# Patient Record
Sex: Female | Born: 1980 | Race: White | Hispanic: No | Marital: Married | Smoking: Former smoker
Health system: Southern US, Community
[De-identification: ages and names within clinical notes are randomized; demographics above are authoritative.]

## PROBLEM LIST (undated history)

## (undated) DIAGNOSIS — E079 Disorder of thyroid, unspecified: Secondary | ICD-10-CM

## (undated) DIAGNOSIS — I10 Essential (primary) hypertension: Secondary | ICD-10-CM

## (undated) DIAGNOSIS — E119 Type 2 diabetes mellitus without complications: Secondary | ICD-10-CM

## (undated) DIAGNOSIS — E669 Obesity, unspecified: Secondary | ICD-10-CM

## (undated) HISTORY — PX: WISDOM TOOTH EXTRACTION: SHX21

## (undated) HISTORY — DX: Disorder of thyroid, unspecified: E07.9

## (undated) HISTORY — DX: Obesity, unspecified: E66.9

## (undated) HISTORY — PX: GANGLION CYST EXCISION: SHX1691

---
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
## 2004-05-21 ENCOUNTER — Ambulatory Visit: Payer: Self-pay | Admitting: Internal Medicine

## 2004-05-31 ENCOUNTER — Ambulatory Visit: Payer: Self-pay | Admitting: Internal Medicine

## 2004-07-09 ENCOUNTER — Ambulatory Visit: Payer: Self-pay | Admitting: Internal Medicine

## 2004-07-29 ENCOUNTER — Ambulatory Visit: Payer: Self-pay | Admitting: Internal Medicine

## 2004-08-22 ENCOUNTER — Ambulatory Visit: Payer: Self-pay | Admitting: Internal Medicine

## 2004-09-17 ENCOUNTER — Ambulatory Visit: Payer: Self-pay | Admitting: Internal Medicine

## 2005-01-27 ENCOUNTER — Ambulatory Visit: Payer: Self-pay | Admitting: Internal Medicine

## 2005-01-28 ENCOUNTER — Ambulatory Visit: Payer: Self-pay | Admitting: Internal Medicine

## 2005-04-25 ENCOUNTER — Other Ambulatory Visit: Admission: RE | Admit: 2005-04-25 | Discharge: 2005-04-25 | Payer: Self-pay | Admitting: Obstetrics and Gynecology

## 2005-07-28 ENCOUNTER — Ambulatory Visit: Payer: Self-pay | Admitting: Internal Medicine

## 2006-05-15 ENCOUNTER — Other Ambulatory Visit: Admission: RE | Admit: 2006-05-15 | Discharge: 2006-05-15 | Payer: Self-pay | Admitting: Obstetrics & Gynecology

## 2007-05-28 ENCOUNTER — Other Ambulatory Visit: Admission: RE | Admit: 2007-05-28 | Discharge: 2007-05-28 | Payer: Self-pay | Admitting: Obstetrics and Gynecology

## 2007-08-14 ENCOUNTER — Emergency Department (HOSPITAL_COMMUNITY): Admission: EM | Admit: 2007-08-14 | Discharge: 2007-08-14 | Payer: Self-pay | Admitting: Emergency Medicine

## 2008-05-30 ENCOUNTER — Other Ambulatory Visit: Admission: RE | Admit: 2008-05-30 | Discharge: 2008-05-30 | Payer: Self-pay | Admitting: Obstetrics and Gynecology

## 2009-05-04 ENCOUNTER — Encounter: Admission: RE | Admit: 2009-05-04 | Discharge: 2009-05-14 | Payer: Self-pay | Admitting: Obstetrics and Gynecology

## 2009-06-12 ENCOUNTER — Inpatient Hospital Stay (HOSPITAL_COMMUNITY): Admission: AD | Admit: 2009-06-12 | Discharge: 2009-06-15 | Payer: Self-pay | Admitting: Obstetrics and Gynecology

## 2009-12-02 ENCOUNTER — Ambulatory Visit: Payer: Self-pay | Admitting: Diagnostic Radiology

## 2009-12-02 ENCOUNTER — Emergency Department (HOSPITAL_BASED_OUTPATIENT_CLINIC_OR_DEPARTMENT_OTHER): Admission: EM | Admit: 2009-12-02 | Discharge: 2009-12-02 | Payer: Self-pay | Admitting: Emergency Medicine

## 2009-12-04 ENCOUNTER — Encounter: Admission: RE | Admit: 2009-12-04 | Discharge: 2009-12-04 | Payer: Self-pay | Admitting: Family Medicine

## 2010-07-05 LAB — URINALYSIS, ROUTINE W REFLEX MICROSCOPIC
Bilirubin Urine: NEGATIVE
Nitrite: NEGATIVE
Specific Gravity, Urine: 1.024 (ref 1.005–1.030)
Urobilinogen, UA: 1 mg/dL (ref 0.0–1.0)

## 2010-07-11 LAB — COMPREHENSIVE METABOLIC PANEL
ALT: 17 U/L (ref 0–35)
AST: 22 U/L (ref 0–37)
BUN: 6 mg/dL (ref 6–23)
Calcium: 8.5 mg/dL (ref 8.4–10.5)
Chloride: 106 mEq/L (ref 96–112)
Creatinine, Ser: 0.72 mg/dL (ref 0.4–1.2)
GFR calc non Af Amer: >60 mL/min (ref >60)
Glucose, Bld: 101 mg/dL — ABNORMAL HIGH (ref 70–99)

## 2010-07-11 LAB — CBC
MCV: 93.4 fL (ref 78.0–100.0)
MCV: 94.9 fL (ref 78.0–100.0)
Platelets: 226 10*3/uL (ref 150–400)
RBC: 4.52 MIL/uL (ref 3.87–5.11)
WBC: 15.4 10*3/uL — ABNORMAL HIGH (ref 4.0–10.5)
WBC: 21.8 10*3/uL — ABNORMAL HIGH (ref 4.0–10.5)

## 2010-07-11 LAB — URIC ACID: Uric Acid, Serum: 5.8 mg/dL (ref 2.4–7.0)

## 2010-07-11 LAB — RPR: RPR Ser Ql: NONREACTIVE

## 2012-09-28 ENCOUNTER — Encounter: Payer: Self-pay | Admitting: *Deleted

## 2012-10-01 ENCOUNTER — Ambulatory Visit: Payer: Self-pay | Admitting: Certified Nurse Midwife

## 2012-10-06 ENCOUNTER — Telehealth: Payer: Self-pay | Admitting: Certified Nurse Midwife

## 2012-10-06 NOTE — Telephone Encounter (Signed)
Patient cancelled appointment for aex with Leota Sauers to 10/07/12. Patient is pregnant and is seeing an ob.

## 2012-10-11 ENCOUNTER — Ambulatory Visit: Payer: Self-pay | Admitting: Certified Nurse Midwife

## 2012-10-23 ENCOUNTER — Other Ambulatory Visit: Payer: Self-pay | Admitting: Certified Nurse Midwife

## 2012-10-29 LAB — OB RESULTS CONSOLE HEPATITIS B SURFACE ANTIGEN
Hepatitis B Surface Ag: NEGATIVE
Hepatitis B Surface Ag: NEGATIVE

## 2012-10-29 LAB — OB RESULTS CONSOLE ABO/RH
RH TYPE: POSITIVE
RH Type: POSITIVE

## 2012-10-29 LAB — OB RESULTS CONSOLE ANTIBODY SCREEN
Antibody Screen: NEGATIVE
Antibody Screen: NEGATIVE

## 2012-10-29 LAB — OB RESULTS CONSOLE HIV ANTIBODY (ROUTINE TESTING)
HIV: NONREACTIVE
HIV: NONREACTIVE

## 2012-10-29 LAB — OB RESULTS CONSOLE GBS: GBS: NEGATIVE

## 2012-10-29 LAB — OB RESULTS CONSOLE RPR
RPR: NONREACTIVE
RPR: NONREACTIVE

## 2012-10-29 LAB — OB RESULTS CONSOLE RUBELLA ANTIBODY, IGM
RUBELLA: IMMUNE
Rubella: IMMUNE

## 2012-11-19 LAB — OB RESULTS CONSOLE GC/CHLAMYDIA
CHLAMYDIA, DNA PROBE: NEGATIVE
CHLAMYDIA, DNA PROBE: NEGATIVE
GC PROBE AMP, GENITAL: NEGATIVE
Gonorrhea: NEGATIVE

## 2013-05-15 ENCOUNTER — Inpatient Hospital Stay (HOSPITAL_COMMUNITY): Payer: 59

## 2013-05-15 ENCOUNTER — Encounter (HOSPITAL_COMMUNITY): Payer: Self-pay | Admitting: *Deleted

## 2013-05-15 ENCOUNTER — Inpatient Hospital Stay (HOSPITAL_COMMUNITY)
Admission: AD | Admit: 2013-05-15 | Discharge: 2013-05-15 | Disposition: A | Discharge status: Home / Self Care | Payer: 59 | Source: Ambulatory Visit | Attending: Obstetrics and Gynecology | Admitting: Obstetrics and Gynecology

## 2013-05-15 DIAGNOSIS — R030 Elevated blood-pressure reading, without diagnosis of hypertension: Secondary | ICD-10-CM | POA: Insufficient documentation

## 2013-05-15 DIAGNOSIS — O99891 Other specified diseases and conditions complicating pregnancy: Secondary | ICD-10-CM | POA: Insufficient documentation

## 2013-05-15 DIAGNOSIS — O99330 Smoking (tobacco) complicating pregnancy, unspecified trimester: Secondary | ICD-10-CM | POA: Insufficient documentation

## 2013-05-15 DIAGNOSIS — O4700 False labor before 37 completed weeks of gestation, unspecified trimester: Secondary | ICD-10-CM | POA: Insufficient documentation

## 2013-05-15 DIAGNOSIS — O9989 Other specified diseases and conditions complicating pregnancy, childbirth and the puerperium: Secondary | ICD-10-CM

## 2013-05-15 LAB — URINALYSIS, ROUTINE W REFLEX MICROSCOPIC
Glucose, UA: NEGATIVE mg/dL
KETONES UR: 15 mg/dL — AB
Nitrite: NEGATIVE
PH: 6 (ref 5.0–8.0)
PROTEIN: NEGATIVE mg/dL
Urobilinogen, UA: 0.2 mg/dL (ref 0.0–1.0)

## 2013-05-15 LAB — CBC
HCT: 32.3 % — ABNORMAL LOW (ref 36.0–46.0)
Hemoglobin: 10.9 g/dL — ABNORMAL LOW (ref 12.0–15.0)
MCH: 29.4 pg (ref 26.0–34.0)
MCHC: 33.7 g/dL (ref 30.0–36.0)
MCV: 87.1 fL (ref 78.0–100.0)
Platelets: 179 10*3/uL (ref 150–400)
RBC: 3.71 MIL/uL — ABNORMAL LOW (ref 3.87–5.11)
RDW: 13.7 % (ref 11.5–15.5)
WBC: 6.7 10*3/uL (ref 4.0–10.5)

## 2013-05-15 LAB — COMPREHENSIVE METABOLIC PANEL
ALK PHOS: 226 U/L — AB (ref 39–117)
ALT: 16 U/L (ref 0–35)
AST: 27 U/L (ref 0–37)
Albumin: 2.2 g/dL — ABNORMAL LOW (ref 3.5–5.2)
BUN: 8 mg/dL (ref 6–23)
CALCIUM: 8.9 mg/dL (ref 8.4–10.5)
CO2: 21 meq/L (ref 19–32)
Chloride: 99 mEq/L (ref 96–112)
Creatinine, Ser: 0.72 mg/dL (ref 0.50–1.10)
GFR calc non Af Amer: >90 mL/min (ref >90)
GLUCOSE: 90 mg/dL (ref 70–99)
POTASSIUM: 4.3 meq/L (ref 3.7–5.3)
Sodium: 133 mEq/L — ABNORMAL LOW (ref 137–147)
Total Bilirubin: 0.3 mg/dL (ref 0.3–1.2)
Total Protein: 5.9 g/dL — ABNORMAL LOW (ref 6.0–8.3)

## 2013-05-15 LAB — URINE MICROSCOPIC-ADD ON

## 2013-05-15 LAB — LACTATE DEHYDROGENASE: LDH: 236 U/L (ref 94–250)

## 2013-05-15 LAB — URIC ACID: Uric Acid, Serum: 6 mg/dL (ref 2.4–7.0)

## 2013-05-15 NOTE — MAU Provider Note (Signed)
History     CSN: [id number]  Arrival date and time: 05/15/13 4098   None     Chief Complaint  Patient presents with  . Labor Eval  . Urinary Tract Infection   Urinary Tract Infection     Brandi [name] is a 33 y.o. [phone] at [redacted]w[redacted]d who originally presented with contractions. She was found to have elevated B/P. She denies any blurry vision, visual changes, chest pain or RUQ pain. She does report a 2/10 headache that she has not taken anything for at this time. She reports that her last baby was born around 80 weeks, and she had B/P problems with that pregnancy.   Past Medical History  Diagnosis Date  . Thyroid disease   . Obesity     Past Surgical History  Procedure Laterality Date  . Ganglion cyst excision    . Wisdom tooth extraction      Family History  Problem Relation Age of Onset  . Hypertension Mother   . Thyroid disease Mother   . Hypertension Father   . Cancer Paternal Grandfather     skin cancer    History  Substance Use Topics  . Smoking status: Current Every Day Smoker -- 0.50 packs/day    Types: Cigarettes  . Smokeless tobacco: Never Used  . Alcohol Use: No    Allergies:  Allergies  Allergen Reactions  . Augmentin [Amoxicillin-Pot Clavulanate] Nausea And Vomiting  . Sulfa Antibiotics Hives    Prescriptions prior to admission  Medication Sig Dispense Refill  . calcium carbonate (TUMS - DOSED IN MG ELEMENTAL CALCIUM) 500 MG chewable tablet Chew 1 tablet by mouth 4 (four) times daily as needed for indigestion or heartburn.      . levothyroxine (SYNTHROID, LEVOTHROID) 88 MCG tablet Take 88 mcg by mouth daily before breakfast.      . Prenatal Vit-Min-FA-Fish Oil ([employer] PRENATAL GUMMY PO) Take 2 tablets by mouth daily.        ROS Physical Exam   Blood pressure 126/68, pulse 118, temperature 97.9 F (36.6 C), temperature source Oral, resp. rate 20, height 5\' 4"  (1.626 m), weight 121.564 kg (268 lb), last menstrual period 08/31/2012.  Physical  Exam  Nursing note and vitals reviewed. Constitutional: She is oriented to person, place, and time. She appears well-developed and well-nourished. No distress.  Cardiovascular: Normal rate.   Respiratory: Effort normal.  GI: Soft. There is no tenderness.  Neurological: She is alert and oriented to person, place, and time.  Skin: Skin is warm and dry.  Psychiatric: She has a normal mood and affect.   FHT: 140, moderate with 15x15 accels, no decels Toco: irregular UC.  MAU Course  Procedures  Results for orders placed during the hospital encounter of 05/15/13 (from the past 24 hour(s))  URINALYSIS, ROUTINE W REFLEX MICROSCOPIC     Status: Abnormal   Collection Time    05/15/13 10:00 AM      Result Value Range   Color, Urine YELLOW  YELLOW   APPearance CLOUDY (*) CLEAR   Specific Gravity, Urine >1.030 (*) 1.005 - 1.030   pH 6.0  5.0 - 8.0   Glucose, UA NEGATIVE  NEGATIVE mg/dL   Hgb urine dipstick TRACE (*) NEGATIVE   Bilirubin Urine SMALL (*) NEGATIVE   Ketones, ur 15 (*) NEGATIVE mg/dL   Protein, ur NEGATIVE  NEGATIVE mg/dL   Urobilinogen, UA 0.2  0.0 - 1.0 mg/dL   Nitrite NEGATIVE  NEGATIVE   Leukocytes, UA SMALL (*) NEGATIVE  URINE MICROSCOPIC-ADD ON     Status: Abnormal   Collection Time    05/15/13 10:00 AM      Result Value Range   Squamous Epithelial / LPF MANY (*) RARE   WBC, UA 11-20  <3 WBC/hpf   RBC / HPF 0-2  <3 RBC/hpf   Bacteria, UA MANY (*) RARE  CBC     Status: Abnormal   Collection Time    05/15/13 11:21 AM      Result Value Range   WBC 6.7  4.0 - 10.5 K/uL   RBC 3.71 (*) 3.87 - 5.11 MIL/uL   Hemoglobin 10.9 (*) 12.0 - 15.0 g/dL   HCT 81.132.3 (*) 91.436.0 - 78.246.0 %   MCV 87.1  78.0 - 100.0 fL   MCH 29.4  26.0 - 34.0 pg   MCHC 33.7  30.0 - 36.0 g/dL   RDW 95.613.7  21.311.5 - 08.615.5 %   Platelets 179  150 - 400 K/uL  URIC ACID     Status: None   Collection Time    05/15/13 11:21 AM      Result Value Range   Uric Acid, Serum 6.0  2.4 - 7.0 mg/dL  COMPREHENSIVE  METABOLIC PANEL     Status: Abnormal   Collection Time    05/15/13 11:21 AM      Result Value Range   Sodium 133 (*) 137 - 147 mEq/L   Potassium 4.3  3.7 - 5.3 mEq/L   Chloride 99  96 - 112 mEq/L   CO2 21  19 - 32 mEq/L   Glucose, Bld 90  70 - 99 mg/dL   BUN 8  6 - 23 mg/dL   Creatinine, Ser 5.780.72  0.50 - 1.10 mg/dL   Calcium 8.9  8.4 - 46.910.5 mg/dL   Total Protein 5.9 (*) 6.0 - 8.3 g/dL   Albumin 2.2 (*) 3.5 - 5.2 g/dL   AST 27  0 - 37 U/L   ALT 16  0 - 35 U/L   Alkaline Phosphatase 226 (*) 39 - 117 U/L   Total Bilirubin 0.3  0.3 - 1.2 mg/dL   GFR calc non Af Amer >90  >90 mL/min   GFR calc Af Amer >90  >90 mL/min  LACTATE DEHYDROGENASE     Status: None   Collection Time    05/15/13 11:21 AM      Result Value Range   LDH 236  94 - 250 U/L   [place] Ob Limited  05/15/2013   OBSTETRICAL ULTRASOUND: This exam was performed within a [facility]. The OB US report was generated in the AS system, and faxed to the ordering physician.   This report is also available in [facility] [employer] and in the [employer] PACS. See AS Obstetric US report.  [place] Fetal Bpp W/o Non Stress  05/15/2013   OBSTETRICAL ULTRASOUND: This exam was performed within a [facility]. The OB US report was generated in the AS system, and faxed to the ordering physician.   This report is also available in [facility] [employer] and in the [facility] [employer] PACS. See AS Obstetric US report.   1115: [facility]e [facility].  1350: Dr. Rana SnareLowe states that he has reviewed all labs, ultrasound and NST. Patient may be DC home.   Assessment and Plan  False labor Elevated B/P in 3rd trimester  Pre-eclampsia danger signs reviewed FU with the office as planned Return to MAU as needed   Mathews RobinsonsHogan,  Franco Collet 05/15/2013, 11:20 AM

## 2013-05-15 NOTE — MAU Note (Signed)
Patient presents with complaint of irregular contractions since last night and a possible UTI.

## 2013-05-15 NOTE — Discharge Instructions (Signed)

## 2013-05-16 LAB — URINE CULTURE: Colony Count: 35000

## 2013-05-24 ENCOUNTER — Encounter (HOSPITAL_COMMUNITY): Payer: Self-pay

## 2013-05-24 ENCOUNTER — Inpatient Hospital Stay (HOSPITAL_COMMUNITY): Payer: 59 | Admitting: Anesthesiology

## 2013-05-24 ENCOUNTER — Encounter (HOSPITAL_COMMUNITY): Payer: 59 | Admitting: Anesthesiology

## 2013-05-24 ENCOUNTER — Inpatient Hospital Stay (HOSPITAL_COMMUNITY)
Admission: AD | Admit: 2013-05-24 | Discharge: 2013-05-27 | DRG: 767 | Disposition: A | Discharge status: Home / Self Care | Payer: 59 | Source: Ambulatory Visit | Attending: Obstetrics and Gynecology | Admitting: Obstetrics and Gynecology

## 2013-05-24 DIAGNOSIS — O1490 Unspecified pre-eclampsia, unspecified trimester: Secondary | ICD-10-CM | POA: Diagnosis present

## 2013-05-24 DIAGNOSIS — E669 Obesity, unspecified: Secondary | ICD-10-CM | POA: Diagnosis present

## 2013-05-24 DIAGNOSIS — O139 Gestational [pregnancy-induced] hypertension without significant proteinuria, unspecified trimester: Principal | ICD-10-CM | POA: Diagnosis present

## 2013-05-24 DIAGNOSIS — Z87891 Personal history of nicotine dependence: Secondary | ICD-10-CM

## 2013-05-24 DIAGNOSIS — E079 Disorder of thyroid, unspecified: Secondary | ICD-10-CM | POA: Diagnosis present

## 2013-05-24 DIAGNOSIS — K219 Gastro-esophageal reflux disease without esophagitis: Secondary | ICD-10-CM | POA: Diagnosis present

## 2013-05-24 DIAGNOSIS — R7989 Other specified abnormal findings of blood chemistry: Secondary | ICD-10-CM | POA: Diagnosis present

## 2013-05-24 DIAGNOSIS — O99214 Obesity complicating childbirth: Secondary | ICD-10-CM

## 2013-05-24 DIAGNOSIS — O99284 Endocrine, nutritional and metabolic diseases complicating childbirth: Secondary | ICD-10-CM

## 2013-05-24 HISTORY — DX: Essential (primary) hypertension: I10

## 2013-05-24 LAB — COMPREHENSIVE METABOLIC PANEL
ALT: 139 U/L — AB (ref 0–35)
AST: 98 U/L — ABNORMAL HIGH (ref 0–37)
Albumin: 2.2 g/dL — ABNORMAL LOW (ref 3.5–5.2)
Alkaline Phosphatase: 329 U/L — ABNORMAL HIGH (ref 39–117)
BUN: 10 mg/dL (ref 6–23)
CO2: 22 mEq/L (ref 19–32)
Calcium: 8.7 mg/dL (ref 8.4–10.5)
Chloride: 102 mEq/L (ref 96–112)
Creatinine, Ser: 0.94 mg/dL (ref 0.50–1.10)
GFR calc non Af Amer: 79 mL/min — ABNORMAL LOW (ref >90)
GLUCOSE: 100 mg/dL — AB (ref 70–99)
Potassium: 4 mEq/L (ref 3.7–5.3)
Sodium: 137 mEq/L (ref 137–147)
Total Bilirubin: 1.6 mg/dL — ABNORMAL HIGH (ref 0.3–1.2)
Total Protein: 6.2 g/dL (ref 6.0–8.3)

## 2013-05-24 LAB — URINALYSIS, ROUTINE W REFLEX MICROSCOPIC
Glucose, UA: NEGATIVE mg/dL
Ketones, ur: NEGATIVE mg/dL
Nitrite: NEGATIVE
PH: 5.5 (ref 5.0–8.0)
Protein, ur: NEGATIVE mg/dL
Specific Gravity, Urine: >1.03 — ABNORMAL HIGH (ref 1.005–1.030)
UROBILINOGEN UA: 0.2 mg/dL (ref 0.0–1.0)

## 2013-05-24 LAB — URINE MICROSCOPIC-ADD ON

## 2013-05-24 LAB — CBC
HCT: 36.4 % (ref 36.0–46.0)
HEMOGLOBIN: 12.2 g/dL (ref 12.0–15.0)
MCH: 29.2 pg (ref 26.0–34.0)
MCHC: 33.5 g/dL (ref 30.0–36.0)
MCV: 87.1 fL (ref 78.0–100.0)
Platelets: 231 10*3/uL (ref 150–400)
RBC: 4.18 MIL/uL (ref 3.87–5.11)
RDW: 14.1 % (ref 11.5–15.5)
WBC: 11.6 10*3/uL — ABNORMAL HIGH (ref 4.0–10.5)

## 2013-05-24 LAB — URIC ACID: URIC ACID, SERUM: 6.3 mg/dL (ref 2.4–7.0)

## 2013-05-24 MED ORDER — FLEET ENEMA 7-19 GM/118ML RE ENEM: 1.0000 | ENEMA | RECTAL | Status: DC | PRN

## 2013-05-24 MED ORDER — ONDANSETRON HCL 4 MG/2ML IJ SOLN
4.0000 mg | Freq: Four times a day (QID) | INTRAMUSCULAR | Status: DC | PRN
Start: 2013-05-24 — End: 2013-05-25
  Administered 2013-05-25: 4 mg via INTRAVENOUS
  Filled 2013-05-24: qty 2

## 2013-05-24 MED ORDER — ACETAMINOPHEN 325 MG PO TABS: 650.0000 mg | ORAL_TABLET | ORAL | Status: DC | PRN

## 2013-05-24 MED ORDER — PHENYLEPHRINE 40 MCG/ML (10ML) SYRINGE FOR IV PUSH (FOR BLOOD PRESSURE SUPPORT): 80.0000 ug | PREFILLED_SYRINGE | INTRAVENOUS | Status: DC | PRN

## 2013-05-24 MED ORDER — DIPHENHYDRAMINE HCL 50 MG/ML IJ SOLN: 12.5000 mg | INTRAMUSCULAR | Status: DC | PRN

## 2013-05-24 MED ORDER — OXYCODONE-ACETAMINOPHEN 5-325 MG PO TABS: 1.0000 | ORAL_TABLET | ORAL | Status: DC | PRN

## 2013-05-24 MED ORDER — LEVOTHYROXINE SODIUM 88 MCG PO TABS
88.0000 ug | ORAL_TABLET | Freq: Every day | ORAL | Status: DC
  Filled 2013-05-24: qty 1

## 2013-05-24 MED ORDER — FENTANYL 2.5 MCG/ML BUPIVACAINE 1/10 % EPIDURAL INFUSION (WH - ANES)
14.0000 mL/h | INTRAMUSCULAR | Status: DC | PRN
  Filled 2013-05-24: qty 125

## 2013-05-24 MED ORDER — OXYTOCIN 40 UNITS IN LACTATED RINGERS INFUSION - SIMPLE MED
62.5000 mL/h | INTRAVENOUS | Status: DC
  Filled 2013-05-24: qty 1000

## 2013-05-24 MED ORDER — CITRIC ACID-SODIUM CITRATE 334-500 MG/5ML PO SOLN
30.0000 mL | ORAL | Status: DC | PRN
  Administered 2013-05-25 (×2): 30 mL via ORAL
  Filled 2013-05-24 (×2): qty 15

## 2013-05-24 MED ORDER — LIDOCAINE HCL (PF) 1 % IJ SOLN
30.0000 mL | INTRAMUSCULAR | Status: DC | PRN
  Filled 2013-05-24: qty 30

## 2013-05-24 MED ORDER — OXYTOCIN BOLUS FROM INFUSION: 500.0000 mL | INTRAVENOUS | Status: DC

## 2013-05-24 MED ORDER — LACTATED RINGERS IV SOLN
500.0000 mL | Freq: Once | INTRAVENOUS | Status: AC
  Administered 2013-05-24: 500 mL via INTRAVENOUS

## 2013-05-24 MED ORDER — MAGNESIUM SULFATE BOLUS VIA INFUSION
4.0000 g | Freq: Once | INTRAVENOUS | Status: AC
  Administered 2013-05-25: 4 g via INTRAVENOUS
  Filled 2013-05-24: qty 500

## 2013-05-24 MED ORDER — LACTATED RINGERS IV SOLN
INTRAVENOUS | Status: DC
  Administered 2013-05-25 (×2): via INTRAVENOUS

## 2013-05-24 MED ORDER — PHENYLEPHRINE 40 MCG/ML (10ML) SYRINGE FOR IV PUSH (FOR BLOOD PRESSURE SUPPORT)
80.0000 ug | PREFILLED_SYRINGE | INTRAVENOUS | Status: DC | PRN
  Filled 2013-05-24: qty 10

## 2013-05-24 MED ORDER — IBUPROFEN 600 MG PO TABS: 600.0000 mg | ORAL_TABLET | Freq: Four times a day (QID) | ORAL | Status: DC | PRN

## 2013-05-24 MED ORDER — BUTORPHANOL TARTRATE 1 MG/ML IJ SOLN: 1.0000 mg | INTRAMUSCULAR | Status: DC | PRN

## 2013-05-24 MED ORDER — LIDOCAINE HCL (PF) 1 % IJ SOLN
INTRAMUSCULAR | Status: DC | PRN
  Administered 2013-05-24 (×2): 4 mL

## 2013-05-24 MED ORDER — MAGNESIUM SULFATE 40 G IN LACTATED RINGERS - SIMPLE
2.0000 g/h | INTRAVENOUS | Status: DC
  Administered 2013-05-25: 2 g/h via INTRAVENOUS
  Filled 2013-05-24: qty 500

## 2013-05-24 MED ORDER — LACTATED RINGERS IV SOLN: 500.0000 mL | INTRAVENOUS | Status: DC | PRN

## 2013-05-24 MED ORDER — FENTANYL 2.5 MCG/ML BUPIVACAINE 1/10 % EPIDURAL INFUSION (WH - ANES)
INTRAMUSCULAR | Status: DC | PRN
  Administered 2013-05-24: 14 mL/h via EPIDURAL

## 2013-05-24 MED ORDER — EPHEDRINE 5 MG/ML INJ
10.0000 mg | INTRAVENOUS | Status: DC | PRN
  Filled 2013-05-24: qty 4

## 2013-05-24 MED ORDER — EPHEDRINE 5 MG/ML INJ: 10.0000 mg | INTRAVENOUS | Status: DC | PRN

## 2013-05-24 NOTE — Anesthesia Procedure Notes (Signed)
Epidural Patient location during procedure: OB Start time: 05/24/2013 11:52 PM  Staffing Anesthesiologist: Shantia Sanford A. Performed by: anesthesiologist   Preanesthetic Checklist Completed: patient identified, site marked, surgical consent, pre-op evaluation, timeout performed, IV checked, risks and benefits discussed and monitors and equipment checked  Epidural Patient position: sitting Prep: site prepped and draped and DuraPrep Patient monitoring: continuous pulse ox and blood pressure Approach: midline Injection technique: LOR air  Needle:  Needle type: Tuohy  Needle gauge: 17 G Needle length: 9 cm and 9 Needle insertion depth: 9 cm Catheter type: closed end flexible Catheter size: 19 Gauge Catheter at skin depth: 14 cm Test dose: negative and Other  Assessment Events: blood not aspirated, injection not painful, no injection resistance, negative IV test and no paresthesia  Additional Notes Patient identified. Risks and benefits discussed including failed block, incomplete  Pain control, post dural puncture headache, nerve damage, paralysis, blood pressure Changes, nausea, vomiting, reactions to medications-both toxic and allergic and post Partum back pain. All questions were answered. Patient expressed understanding and wished to proceed. Sterile technique was used throughout procedure. Epidural site was Dressed with sterile barrier dressing. No paresthesias, signs of intravascular injection Or signs of intrathecal spread were encountered.  Patient was more comfortable after the epidural was dosed. Please see RN's note for documentation of vital signs and FHR which are stable.

## 2013-05-24 NOTE — MAU Note (Signed)
Was 3 cm last week.  Having ctxs about 9 min apart.  Denies bleeding.  Reports yellow mucus d/c.

## 2013-05-24 NOTE — Anesthesia Preprocedure Evaluation (Signed)
Anesthesia Evaluation  Patient identified by MRN, date of birth, ID band Patient awake    Reviewed: Allergy & Precautions, H&P , Patient's Chart, lab work & pertinent test results  Airway Mallampati: III TM Distance: >3 FB Neck ROM: Full    Dental no notable dental hx. (+) Teeth Intact   Pulmonary former smoker,  breath sounds clear to auscultation  Pulmonary exam normal       Cardiovascular hypertension, Pt. on medications Rhythm:Regular Rate:Normal     Neuro/Psych negative neurological ROS  negative psych ROS   GI/Hepatic GERD-  ,Elevated LFT's   Endo/Other  Morbid obesity  Renal/GU negative Renal ROS  negative genitourinary   Musculoskeletal negative musculoskeletal ROS (+)   Abdominal (+) + obese,   Peds  Hematology negative hematology ROS (+)   Anesthesia Other Findings   Reproductive/Obstetrics (+) Pregnancy Pre ecclampsia                           Anesthesia Physical Anesthesia Plan  ASA: III  Anesthesia Plan: Epidural   Post-op Pain Management:    Induction:   Airway Management Planned: Natural Airway  Additional Equipment:   Intra-op Plan:   Post-operative Plan:   Informed Consent: I have reviewed the patients History and Physical, chart, labs and discussed the procedure including the risks, benefits and alternatives for the proposed anesthesia with the patient or authorized representative who has indicated his/her understanding and acceptance.     Plan Discussed with: Anesthesiologist  Anesthesia Plan Comments:         Anesthesia Quick Evaluation

## 2013-05-25 ENCOUNTER — Encounter (HOSPITAL_COMMUNITY): Payer: Self-pay

## 2013-05-25 ENCOUNTER — Encounter (HOSPITAL_COMMUNITY)
Admission: AD | Disposition: A | Discharge status: Home / Self Care | Payer: Self-pay | Source: Ambulatory Visit | Attending: Obstetrics and Gynecology

## 2013-05-25 ENCOUNTER — Encounter (HOSPITAL_COMMUNITY): Payer: 59 | Admitting: Anesthesiology

## 2013-05-25 ENCOUNTER — Inpatient Hospital Stay (HOSPITAL_COMMUNITY): Payer: 59 | Admitting: Anesthesiology

## 2013-05-25 HISTORY — PX: DILATION AND EVACUATION: SHX1459

## 2013-05-25 LAB — CBC
HCT: 32.8 % — ABNORMAL LOW (ref 36.0–46.0)
HEMATOCRIT: 36.1 % (ref 36.0–46.0)
HEMOGLOBIN: 10.9 g/dL — AB (ref 12.0–15.0)
Hemoglobin: 12.2 g/dL (ref 12.0–15.0)
MCH: 29.6 pg (ref 26.0–34.0)
MCH: 28.9 pg (ref 26.0–34.0)
MCHC: 33.8 g/dL (ref 30.0–36.0)
MCHC: 33.2 g/dL (ref 30.0–36.0)
MCV: 87.6 fL (ref 78.0–100.0)
MCV: 87 fL (ref 78.0–100.0)
PLATELETS: 240 10*3/uL (ref 150–400)
PLATELETS: 225 10*3/uL (ref 150–400)
RBC: 4.12 MIL/uL (ref 3.87–5.11)
RBC: 3.77 MIL/uL — AB (ref 3.87–5.11)
RDW: 14.2 % (ref 11.5–15.5)
RDW: 14.1 % (ref 11.5–15.5)
WBC: 16.6 10*3/uL — AB (ref 4.0–10.5)
WBC: 20.6 10*3/uL — AB (ref 4.0–10.5)

## 2013-05-25 LAB — TYPE AND SCREEN
ABO/RH(D): A POS
ANTIBODY SCREEN: NEGATIVE

## 2013-05-25 LAB — URIC ACID
Uric Acid, Serum: 6.7 mg/dL (ref 2.4–7.0)
Uric Acid, Serum: 6.3 mg/dL (ref 2.4–7.0)

## 2013-05-25 LAB — COMPREHENSIVE METABOLIC PANEL
ALBUMIN: 2.2 g/dL — AB (ref 3.5–5.2)
ALK PHOS: 337 U/L — AB (ref 39–117)
ALK PHOS: 306 U/L — AB (ref 39–117)
ALT: 124 U/L — ABNORMAL HIGH (ref 0–35)
ALT: 102 U/L — ABNORMAL HIGH (ref 0–35)
AST: 83 U/L — AB (ref 0–37)
AST: 70 U/L — AB (ref 0–37)
Albumin: 2 g/dL — ABNORMAL LOW (ref 3.5–5.2)
BILIRUBIN TOTAL: 1.6 mg/dL — AB (ref 0.3–1.2)
BUN: 11 mg/dL (ref 6–23)
BUN: 10 mg/dL (ref 6–23)
CHLORIDE: 102 meq/L (ref 96–112)
CHLORIDE: 100 meq/L (ref 96–112)
CO2: 21 mEq/L (ref 19–32)
CO2: 20 meq/L (ref 19–32)
Calcium: 8.8 mg/dL (ref 8.4–10.5)
Calcium: 8.1 mg/dL — ABNORMAL LOW (ref 8.4–10.5)
Creatinine, Ser: 1.1 mg/dL (ref 0.50–1.10)
Creatinine, Ser: 0.98 mg/dL (ref 0.50–1.10)
GFR calc Af Amer: 76 mL/min — ABNORMAL LOW (ref >90)
GFR calc Af Amer: 87 mL/min — ABNORMAL LOW (ref >90)
GFR calc non Af Amer: 65 mL/min — ABNORMAL LOW (ref >90)
GFR calc non Af Amer: 75 mL/min — ABNORMAL LOW (ref >90)
Glucose, Bld: 119 mg/dL — ABNORMAL HIGH (ref 70–99)
Glucose, Bld: 137 mg/dL — ABNORMAL HIGH (ref 70–99)
POTASSIUM: 4.8 meq/L (ref 3.7–5.3)
POTASSIUM: 4.8 meq/L (ref 3.7–5.3)
SODIUM: 137 meq/L (ref 137–147)
SODIUM: 133 meq/L — AB (ref 137–147)
Total Bilirubin: 1.4 mg/dL — ABNORMAL HIGH (ref 0.3–1.2)
Total Protein: 6 g/dL (ref 6.0–8.3)
Total Protein: 5.7 g/dL — ABNORMAL LOW (ref 6.0–8.3)

## 2013-05-25 LAB — ABO/RH: ABO/RH(D): A POS

## 2013-05-25 LAB — RPR: RPR: NONREACTIVE

## 2013-05-25 LAB — MAGNESIUM
Magnesium: 4.2 mg/dL — ABNORMAL HIGH (ref 1.5–2.5)
Magnesium: 4.9 mg/dL — ABNORMAL HIGH (ref 1.5–2.5)

## 2013-05-25 SURGERY — DILATION AND EVACUATION, UTERUS
Anesthesia: Epidural

## 2013-05-25 MED ORDER — FLEET ENEMA 7-19 GM/118ML RE ENEM: 1.0000 | ENEMA | Freq: Every day | RECTAL | Status: DC | PRN

## 2013-05-25 MED ORDER — ONDANSETRON HCL 4 MG/2ML IJ SOLN
INTRAMUSCULAR | Status: AC
  Filled 2013-05-25: qty 2

## 2013-05-25 MED ORDER — TETANUS-DIPHTH-ACELL PERTUSSIS 5-2.5-18.5 LF-MCG/0.5 IM SUSP: 0.5000 mL | Freq: Once | INTRAMUSCULAR | Status: DC

## 2013-05-25 MED ORDER — ONDANSETRON HCL 4 MG/2ML IJ SOLN: 4.0000 mg | INTRAMUSCULAR | Status: DC | PRN

## 2013-05-25 MED ORDER — CLINDAMYCIN PHOSPHATE 900 MG/50ML IV SOLN
900.0000 mg | Freq: Once | INTRAVENOUS | Status: AC
  Administered 2013-05-25: 900 mg via INTRAVENOUS
  Filled 2013-05-25: qty 50

## 2013-05-25 MED ORDER — DEXAMETHASONE SODIUM PHOSPHATE 10 MG/ML IJ SOLN
INTRAMUSCULAR | Status: AC
  Filled 2013-05-25: qty 1

## 2013-05-25 MED ORDER — SENNOSIDES-DOCUSATE SODIUM 8.6-50 MG PO TABS
2.0000 | ORAL_TABLET | ORAL | Status: DC
  Administered 2013-05-25 – 2013-05-27 (×2): 2 via ORAL
  Filled 2013-05-25 (×2): qty 2

## 2013-05-25 MED ORDER — ONDANSETRON HCL 4 MG PO TABS: 4.0000 mg | ORAL_TABLET | ORAL | Status: DC | PRN

## 2013-05-25 MED ORDER — LEVOTHYROXINE SODIUM 88 MCG PO TABS
88.0000 ug | ORAL_TABLET | Freq: Every day | ORAL | Status: DC
  Administered 2013-05-26 – 2013-05-27 (×2): 88 ug via ORAL
  Filled 2013-05-25 (×2): qty 1

## 2013-05-25 MED ORDER — METOCLOPRAMIDE HCL 5 MG/ML IJ SOLN
INTRAMUSCULAR | Status: DC | PRN
  Administered 2013-05-25: 10 mg via INTRAVENOUS

## 2013-05-25 MED ORDER — DEXAMETHASONE SODIUM PHOSPHATE 10 MG/ML IJ SOLN
INTRAMUSCULAR | Status: DC | PRN
  Administered 2013-05-25: 10 mg via INTRAVENOUS

## 2013-05-25 MED ORDER — LACTATED RINGERS IV SOLN
INTRAVENOUS | Status: DC
  Administered 2013-05-25 – 2013-05-26 (×3): via INTRAVENOUS

## 2013-05-25 MED ORDER — TERBUTALINE SULFATE 1 MG/ML IJ SOLN: 0.2500 mg | Freq: Once | INTRAMUSCULAR | Status: DC | PRN

## 2013-05-25 MED ORDER — SALINE SPRAY 0.65 % NA SOLN
1.0000 | NASAL | Status: DC | PRN
  Administered 2013-05-25: 1 via NASAL
  Filled 2013-05-25: qty 44

## 2013-05-25 MED ORDER — MAGNESIUM SULFATE 40 G IN LACTATED RINGERS - SIMPLE
2.0000 g/h | INTRAVENOUS | Status: AC
  Administered 2013-05-25: 2 g/h via INTRAVENOUS
  Filled 2013-05-25 (×2): qty 500

## 2013-05-25 MED ORDER — PRENATAL MULTIVITAMIN CH
1.0000 | ORAL_TABLET | Freq: Every day | ORAL | Status: DC
Start: 2013-05-25 — End: 2013-05-27
  Administered 2013-05-25 – 2013-05-26 (×2): 1 via ORAL
  Filled 2013-05-25 (×2): qty 1

## 2013-05-25 MED ORDER — SIMETHICONE 80 MG PO CHEW: 80.0000 mg | CHEWABLE_TABLET | ORAL | Status: DC | PRN

## 2013-05-25 MED ORDER — METOCLOPRAMIDE HCL 5 MG/ML IJ SOLN
INTRAMUSCULAR | Status: AC
  Filled 2013-05-25: qty 2

## 2013-05-25 MED ORDER — LANOLIN HYDROUS EX OINT: TOPICAL_OINTMENT | CUTANEOUS | Status: DC | PRN

## 2013-05-25 MED ORDER — FENTANYL CITRATE 0.05 MG/ML IJ SOLN: 25.0000 ug | INTRAMUSCULAR | Status: DC | PRN

## 2013-05-25 MED ORDER — SODIUM BICARBONATE 8.4 % IV SOLN
INTRAVENOUS | Status: DC | PRN
  Administered 2013-05-25 (×2): 5 mL via EPIDURAL

## 2013-05-25 MED ORDER — IBUPROFEN 600 MG PO TABS
600.0000 mg | ORAL_TABLET | Freq: Four times a day (QID) | ORAL | Status: DC
  Administered 2013-05-25 – 2013-05-27 (×8): 600 mg via ORAL
  Filled 2013-05-25 (×8): qty 1

## 2013-05-25 MED ORDER — OXYTOCIN 40 UNITS IN LACTATED RINGERS INFUSION - SIMPLE MED
1.0000 m[IU]/min | INTRAVENOUS | Status: DC
  Administered 2013-05-25: 40 [IU] via INTRAVENOUS
  Administered 2013-05-25: 1 m[IU]/min via INTRAVENOUS

## 2013-05-25 MED ORDER — WITCH HAZEL-GLYCERIN EX PADS: 1.0000 "application " | MEDICATED_PAD | CUTANEOUS | Status: DC | PRN

## 2013-05-25 MED ORDER — ONDANSETRON HCL 4 MG/2ML IJ SOLN
INTRAMUSCULAR | Status: DC | PRN
  Administered 2013-05-25: 4 mg via INTRAVENOUS

## 2013-05-25 MED ORDER — DIBUCAINE 1 % RE OINT: 1.0000 "application " | TOPICAL_OINTMENT | RECTAL | Status: DC | PRN

## 2013-05-25 MED ORDER — OXYCODONE-ACETAMINOPHEN 5-325 MG PO TABS
1.0000 | ORAL_TABLET | ORAL | Status: DC | PRN
  Administered 2013-05-26 (×3): 1 via ORAL
  Filled 2013-05-25 (×3): qty 1

## 2013-05-25 MED ORDER — BISACODYL 10 MG RE SUPP
10.0000 mg | Freq: Every day | RECTAL | Status: DC | PRN
  Administered 2013-05-27: 10 mg via RECTAL
  Filled 2013-05-25: qty 1

## 2013-05-25 MED ORDER — BENZOCAINE-MENTHOL 20-0.5 % EX AERO
1.0000 "application " | INHALATION_SPRAY | CUTANEOUS | Status: DC | PRN
  Administered 2013-05-25: 1 via TOPICAL
  Filled 2013-05-25: qty 56

## 2013-05-25 MED ORDER — DIPHENHYDRAMINE HCL 25 MG PO CAPS: 25.0000 mg | ORAL_CAPSULE | Freq: Four times a day (QID) | ORAL | Status: DC | PRN

## 2013-05-25 MED ORDER — ZOLPIDEM TARTRATE 5 MG PO TABS: 5.0000 mg | ORAL_TABLET | Freq: Every evening | ORAL | Status: DC | PRN

## 2013-05-25 SURGICAL SUPPLY — 19 items
CATH ROBINSON RED A/P 16FR (CATHETERS) ×1 IMPLANT
CLOTH BEACON ORANGE TIMEOUT ST (SAFETY) ×2 IMPLANT
CONTAINER PREFILL 10% NBF 60ML (FORM) ×2 IMPLANT
DRSG TELFA 3X8 NADH (GAUZE/BANDAGES/DRESSINGS) ×2 IMPLANT
GLOVE BIO SURGEON STRL SZ7.5 (GLOVE) ×4 IMPLANT
GOWN STRL REIN XL XLG (GOWN DISPOSABLE) ×4 IMPLANT
NDL SPNL 22GX3.5 QUINCKE BK (NEEDLE) ×1 IMPLANT
NEEDLE SPNL 22GX3.5 QUINCKE BK (NEEDLE) IMPLANT
PACK VAGINAL MINOR WOMEN LF (CUSTOM PROCEDURE TRAY) ×2 IMPLANT
PAD DRESSING TELFA 3X8 NADH (GAUZE/BANDAGES/DRESSINGS) ×1 IMPLANT
PAD OB MATERNITY 4.3X12.25 (PERSONAL CARE ITEMS) ×2 IMPLANT
PAD PREP 24X48 CUFFED NSTRL (MISCELLANEOUS) ×2 IMPLANT
SET BERKELEY SUCTION TUBING (SUCTIONS) ×1 IMPLANT
SYR CONTROL 10ML LL (SYRINGE) ×1 IMPLANT
TOP DISP BERKELEY (MISCELLANEOUS) ×1 IMPLANT
TOWEL OR 17X24 6PK STRL BLUE (TOWEL DISPOSABLE) ×4 IMPLANT
TRAY FOLEY BAG SILVER LF 14FR (CATHETERS) ×1 IMPLANT
VACURETTE 8 RIGID CVD (CANNULA) ×1 IMPLANT
WATER STERILE IRR 1000ML POUR (IV SOLUTION) ×2 IMPLANT

## 2013-05-25 NOTE — Transfer of Care (Signed)
Immediate Anesthesia Transfer of Care Note  Patient: Brandi CarrelJulie [name]  Procedure(s) Performed: Procedure(s): DILATATION AND CURETTAGE (N/A)  Patient Location: PACU  Anesthesia Type:Epidural  Level of Consciousness: awake, alert , oriented and patient cooperative  Airway & Oxygen Therapy: Patient Spontanous Breathing  Post-op Assessment: Report given to PACU RN and Post -op Vital signs reviewed and stable  Post vital signs: Reviewed and stable  Complications: No apparent anesthesia complications

## 2013-05-25 NOTE — Progress Notes (Signed)
Risks of IU synechia and infertility also reviewed with patient and husband.

## 2013-05-25 NOTE — Anesthesia Postprocedure Evaluation (Signed)
  Anesthesia Post-op Note  Patient: Brandi [name]  Procedure(s) Performed: * No procedures listed *  Patient Location: Mother/Baby  Anesthesia Type:Epidural  Level of Consciousness: awake, alert  and oriented  Airway and Oxygen Therapy: Patient Spontanous Breathing  Post-op Pain: none  Post-op Assessment: Post-op Vital signs reviewed  Post-op Vital Signs: Reviewed and stable  Complications: No apparent anesthesia complications. [facility] suspected retained [facility].

## 2013-05-25 NOTE — Anesthesia Preprocedure Evaluation (Signed)
Anesthesia Evaluation  Patient identified by MRN, date of birth, ID band Patient awake    Reviewed: Allergy & Precautions, H&P , NPO status , Patient's Chart, lab work & pertinent test results  Airway Mallampati: III TM Distance: >3 FB Neck ROM: Full    Dental no notable dental hx. (+) Teeth Intact   Pulmonary former smoker,  breath sounds clear to auscultation  Pulmonary exam normal       Cardiovascular hypertension, Pt. on medications Rhythm:Regular Rate:Normal     Neuro/Psych negative neurological ROS  negative psych ROS   GI/Hepatic GERD-  ,Elevated LFT's   Endo/Other  Morbid obesity  Renal/GU negative Renal ROS  negative genitourinary   Musculoskeletal negative musculoskeletal ROS (+)   Abdominal (+) + obese,   Peds  Hematology negative hematology ROS (+)   Anesthesia Other Findings   Reproductive/Obstetrics Pre ecclampsia on Magnesium Sulfate.                           Anesthesia Physical  Anesthesia Plan  ASA: III and emergent  Anesthesia Plan: Epidural   Post-op Pain Management:    Induction:   Airway Management Planned: Natural Airway  Additional Equipment:   Intra-op Plan:   Post-operative Plan:   Informed Consent: I have reviewed the patients History and Physical, chart, labs and discussed the procedure including the risks, benefits and alternatives for the proposed anesthesia with the patient or authorized representative who has indicated his/her understanding and acceptance.     Plan Discussed with: Anesthesiologist, CRNA and Surgeon  Anesthesia Plan Comments:         Anesthesia Quick Evaluation

## 2013-05-25 NOTE — Progress Notes (Signed)
Delivery Note At 5:08 AM a viable female was delivered via Vaginal, Spontaneous Delivery (Presentation: ; Occiput Anterior).  APGAR:8 ,9 ; weight pending .   Placenta status: manually extracted-partial accreta at one point in uterine fundus. Cavity feels clean. To pathology. , .  Cord:  3 vessels with the following complications: None.  Cord pH: pending  Anesthesia: Epidural  Episiotomy: None Lacerations: small second degree outlet lac repaired Suture Repair: 3.0 vicryl rapide Est. Blood Loss (mL): 400  Mom to postpartum.  Baby to Couplet care / Skin to Skin  .  Landis Dowdy II,Kealey Kemmer E 05/25/2013, 5:24 AM

## 2013-05-25 NOTE — Anesthesia Postprocedure Evaluation (Signed)
  Anesthesia Post-op Note  Patient: Brandi CarrelJulie Macneill  Procedure(s) Performed: * No procedures listed *  Patient Location: A-ICU  Anesthesia Type:Epidural  Level of Consciousness: awake  Airway and Oxygen Therapy: Patient Spontanous Breathing  Post-op Pain: none  Post-op Assessment: Patient's Cardiovascular Status Stable, Respiratory Function Stable, Patent Airway, No signs of Nausea or vomiting, Adequate PO intake, Pain level controlled, No headache, No backache, No residual numbness and No residual motor weakness  Post-op Vital Signs: Reviewed and stable  Complications: No apparent anesthesia complications

## 2013-05-25 NOTE — Progress Notes (Signed)
CTSP  Bleeding more than ususal Uterus firm  I am unable to explore upper uterine cavity due to uterine contraction  VSS   D/W patient and husband recommendation for D&C and risks including infection, uterine perforation and organ damage, bleeding/transfusion-HIV/Hep, DVT/PE, pneumonia. All questions answered.

## 2013-05-25 NOTE — Addendum Note (Signed)
Addendum created 05/25/13 1301 by Suella Groveoderick C Jasdeep Dejarnett, CRNA   Modules edited: Anesthesia LDA

## 2013-05-25 NOTE — Brief Op Note (Signed)
05/24/2013 - 05/25/2013  6:25 AM  PATIENT:  Brandi [name]  33 y.o. female  PRE-OPERATIVE DIAGNOSIS:  Post partum hemorrhage  POST-OPERATIVE DIAGNOSIS:  Post Partum hemorrhage  PROCEDURE:  Procedure(s): DILATATION AND CURETTAGE (N/A)  SURGEON:  Surgeon(s) and Role:    * Leslie AndreaJames E Chelcee Korpi II, MD - Primary  PHYSICIAN ASSISTANT:   ASSISTANTS: none   ANESTHESIA:   epidural  EBL:  Total I/O In: 2293.7 [P.O.:540; I.V.:1753.7] Out: 515 [Urine:115; Blood:400]  BLOOD ADMINISTERED:none  DRAINS: Urinary Catheter (Foley)   LOCAL MEDICATIONS USED:  NONE  SPECIMEN:  Source of Specimen:  endometrial curretage  DISPOSITION OF SPECIMEN:  PATHOLOGY  COUNTS:  YES  TOURNIQUET:  * No tourniquets in log *  DICTATION: .Other Dictation: Dictation Number [id number]  PLAN OF CARE: Admit to inpatient   PATIENT DISPOSITION:  PACU - hemodynamically stable.   Delay start of Pharmacological VTE agent (>24hrs) due to surgical blood loss or risk of bleeding: not applicable

## 2013-05-25 NOTE — Lactation Note (Addendum)
This note was copied from the chart of Brandi Jaci CarrelJulie Gripp. Lactation Consultation Note   Initial consult with this mom and baby, now 5 hours post partum, and 38 1/[redacted] weeks gestation. Mom had a PPH, and was to OR after delivery, blood loss 400 mls, doing well at present. In AICU on magnesium drip. The baby is latching well, wide mouth, strong pulls and suckles, visible swallows. I showed mom how to hand express, lactation services reviewed, breast feeding teaching from the Baby and Me book done. Dad very involved, and logging all feeds for mom. Mom knows to call for questions/concerns, as needed.  Patient Name: Brandi [name] Reason for consult: Initial assessment   Maternal Data Formula Feeding for Exclusion: Yes Reason for exclusion: Mother's choice to formula and breast feed on admission;Admission to Intensive Care Unit (ICU) post-partum (mom in AICU on magnesium drip) Infant to breast within first hour of birth: No Breastfeeding delayed due to:: Maternal status Has patient been taught Hand Expression?: Yes Does the patient have breastfeeding experience prior to this delivery?: Yes  Feeding Feeding Type: Breast Fed Length of feed: 30 min  LATCH Score/Interventions Latch: Grasps breast easily, tongue down, lips flanged, rhythmical sucking.  Audible Swallowing: A few with stimulation  Type of Nipple: Everted at rest and after stimulation  Comfort (Breast/Nipple): Soft / non-tender     Hold (Positioning): Assistance needed to correctly position infant at breast and maintain latch. Intervention(s): Breastfeeding basics reviewed;Support Pillows;Position options;Skin to skin  LATCH Score: 8  Lactation Tools Discussed/Used     Consult Status Consult Status: Follow-up Date: 05/26/13 Follow-up type: In-patient    Alfred LevinsLee, Daley Gosse [name], 11:10 AM

## 2013-05-25 NOTE — Anesthesia Postprocedure Evaluation (Signed)
  Anesthesia Post-op Note  Patient: Brandi [name]  Procedure(s) Performed: * No procedures listed *  Patient Location: A-ICU  Anesthesia Type:Epidural  Level of Consciousness: awake  Airway and Oxygen Therapy: Patient Spontanous Breathing  Post-op Pain: none  Post-op Assessment: Patient's Cardiovascular Status Stable, Respiratory Function Stable, No signs of Nausea or vomiting, Adequate PO intake, Pain level controlled, No headache, No backache, No residual numbness and No residual motor weakness  Post-op Vital Signs: Reviewed and stable  Complications: No apparent anesthesia complications

## 2013-05-25 NOTE — Lactation Note (Signed)
This note was copied from the chart of Girl Jaci CarrelJulie Haymer. Lactation Consultation Note     Follow up consult with this mom and baby, now 8 hours post partum. Mom has been latching baby independently, and when the latch is uncomfortable, mom unlatches and relatches. I showed mom how to use cross cradle hold, and baby has a very strong suck with lots of breast movement. Mom and dad a great team - they were assured both baby and  mom are doing well. They know to call for questions/conncerns  Patient Name: Girl Jaci CarrelJulie Minton ZOXWR'UToday's Date: 05/25/2013 Reason for consult: Follow-up assessment   Maternal Data    Feeding Feeding Type: Breast Fed Length of feed: 10 min  LATCH Score/Interventions Latch: Grasps breast easily, tongue down, lips flanged, rhythmical sucking.  Audible Swallowing: A few with stimulation Intervention(s): Skin to skin;Hand expression  Type of Nipple: Everted at rest and after stimulation  Comfort (Breast/Nipple): Soft / non-tender     Hold (Positioning): Assistance needed to correctly position infant at breast and maintain latch. (I showed mom how to position for cross cradle hold) Intervention(s): Breastfeeding basics reviewed;Support Pillows;Position options;Skin to skin  LATCH Score: 8  Lactation Tools Discussed/Used     Consult Status Consult Status: Follow-up Date: 05/26/13 Follow-up type: In-patient    Alfred LevinsLee, Lorinda Copland Anne 05/25/2013, 2:00 PM

## 2013-05-25 NOTE — Progress Notes (Signed)
BP stable FHT + accels UCs about q2-4 min Patient comfortable with epidural UO about 50 cc / past hour CBC stable CMET pending Cx 8.5 to 9.0 cm in past hour per nurse check Will begin pitocin augmentation D/W patient and husband DTR 1 +

## 2013-05-25 NOTE — Progress Notes (Signed)
UR chart review completed.  

## 2013-05-25 NOTE — H&P (Signed)
Brandi CarrelJulie Schwebke is a 33 y.o. female presenting for UCs starting about 5 pm. No HA, no vision change, no epigastric pain. C/O swelling in feet.  Maternal Medical History:  Reason for admission: Contractions.   Contractions: Onset was 3-5 hours ago.    Fetal activity: Perceived fetal activity is normal.      OB History   Grav Para Term Preterm Abortions TAB SAB Ect Mult Living   4 1  1 2  2   1      Past Medical History  Diagnosis Date  . Thyroid disease   . Obesity   . Hypertension     GHTN   Past Surgical History  Procedure Laterality Date  . Ganglion cyst excision    . Wisdom tooth extraction     Family History: family history includes Cancer in her paternal grandfather; Hypertension in her father and mother; Thyroid disease in her mother. Social History:  reports that she quit smoking about 8 months ago. Her smoking use included Cigarettes. She smoked 0.50 packs per day. She has never used smokeless tobacco. She reports that she does not drink alcohol or use illicit drugs.   Prenatal Transfer Tool  Maternal Diabetes: No Genetic Screening: Normal Maternal Ultrasounds/Referrals: Normal Fetal Ultrasounds or other Referrals:  None Maternal Substance Abuse:  No Significant Maternal Medications:  None Significant Maternal Lab Results:  None Other Comments:  None  Review of Systems  Eyes: Negative for blurred vision.  Gastrointestinal: Negative for abdominal pain.  Neurological: Negative for headaches.    Dilation: 8.5 Effacement (%): 90 Station: 0 Exam by:: OGE EnergyFarmer RN Blood pressure 133/80, pulse 96, temperature 97.5 F (36.4 C), temperature source Axillary, resp. rate 20, height 5\' 3"  (1.6 m), weight 268 lb (121.564 kg), last menstrual period 08/31/2012, SpO2 98.00%. Maternal Exam:  Uterine Assessment: Contraction strength is firm.  Contraction frequency is regular.   Abdomen: Fetal presentation: vertex     Fetal Exam Fetal State Assessment: Category I - tracings  are normal.     Physical Exam  Cardiovascular: Normal rate and regular rhythm.   Respiratory: Effort normal and breath sounds normal.  GI: Soft. There is no tenderness.  Neurological:  DTR 1+    UO 35 cc/ 3 hours  RR = 20  Prenatal labs: ABO, Rh: --/--/A POS (02/03 2143) Antibody: NEG (02/03 2143) Rubella: Immune, Immune (07/11 0000) RPR: Nonreactive, Nonreactive (07/11 0000)  HBsAg: Negative, Negative (07/11 0000)  HIV: Non-reactive, Non-reactive (07/11 0000)  GBS: Negative (07/11 0000)   Results for orders placed during the hospital encounter of 05/24/13 (from the past 24 hour(s))  URINALYSIS, ROUTINE W REFLEX MICROSCOPIC     Status: Abnormal   Collection Time    05/24/13  9:25 PM      Result Value Range   Color, Urine YELLOW  YELLOW   APPearance HAZY (*) CLEAR   Specific Gravity, Urine >1.030 (*) 1.005 - 1.030   pH 5.5  5.0 - 8.0   Glucose, UA NEGATIVE  NEGATIVE mg/dL   Hgb urine dipstick MODERATE (*) NEGATIVE   Bilirubin Urine MODERATE (*) NEGATIVE   Ketones, ur NEGATIVE  NEGATIVE mg/dL   Protein, ur NEGATIVE  NEGATIVE mg/dL   Urobilinogen, UA 0.2  0.0 - 1.0 mg/dL   Nitrite NEGATIVE  NEGATIVE   Leukocytes, UA SMALL (*) NEGATIVE  URINE MICROSCOPIC-ADD ON     Status: None   Collection Time    05/24/13  9:25 PM      Result Value Range  Squamous Epithelial / LPF RARE  RARE   WBC, UA 0-2  <3 WBC/hpf   RBC / HPF 3-6  <3 RBC/hpf   Bacteria, UA RARE  RARE  CBC     Status: Abnormal   Collection Time    05/24/13  9:42 PM      Result Value Range   WBC 11.6 (*) 4.0 - 10.5 K/uL   RBC 4.18  3.87 - 5.11 MIL/uL   Hemoglobin 12.2  12.0 - 15.0 g/dL   HCT 40.9  81.1 - 91.4 %   MCV 87.1  78.0 - 100.0 fL   MCH 29.2  26.0 - 34.0 pg   MCHC 33.5  30.0 - 36.0 g/dL   RDW 78.2  95.6 - 21.3 %   Platelets 231  150 - 400 K/uL  COMPREHENSIVE METABOLIC PANEL     Status: Abnormal   Collection Time    05/24/13  9:42 PM      Result Value Range   Sodium 137  137 - 147 mEq/L    Potassium 4.0  3.7 - 5.3 mEq/L   Chloride 102  96 - 112 mEq/L   CO2 22  19 - 32 mEq/L   Glucose, Bld 100 (*) 70 - 99 mg/dL   BUN 10  6 - 23 mg/dL   Creatinine, Ser 0.86  0.50 - 1.10 mg/dL   Calcium 8.7  8.4 - 57.8 mg/dL   Total Protein 6.2  6.0 - 8.3 g/dL   Albumin 2.2 (*) 3.5 - 5.2 g/dL   AST 98 (*) 0 - 37 U/L   ALT 139 (*) 0 - 35 U/L   Alkaline Phosphatase 329 (*) 39 - 117 U/L   Total Bilirubin 1.6 (*) 0.3 - 1.2 mg/dL   GFR calc non Af Amer 79 (*) >90 mL/min   GFR calc Af Amer >90  >90 mL/min  URIC ACID     Status: None   Collection Time    05/24/13  9:42 PM      Result Value Range   Uric Acid, Serum 6.3  2.4 - 7.0 mg/dL  TYPE AND SCREEN     Status: None   Collection Time    05/24/13  9:43 PM      Result Value Range   ABO/RH(D) A POS     Antibody Screen NEG     Sample Expiration 05/27/2013      Assessment/Plan: 33 yo G4P1 at 30 1/7 weeks with preeclampsia and oliguria D/W patient and husband above Stat magnesium level ordered Will watch RR and DTR closely Labor progressing well   Denni France II,Arsen Mangione E 05/25/2013, 3:04 AM

## 2013-05-26 LAB — CBC
HEMATOCRIT: 26 % — AB (ref 36.0–46.0)
Hemoglobin: 8.9 g/dL — ABNORMAL LOW (ref 12.0–15.0)
MCH: 29.7 pg (ref 26.0–34.0)
MCHC: 34.2 g/dL (ref 30.0–36.0)
MCV: 86.7 fL (ref 78.0–100.0)
Platelets: 222 10*3/uL (ref 150–400)
RBC: 3 MIL/uL — ABNORMAL LOW (ref 3.87–5.11)
RDW: 14.6 % (ref 11.5–15.5)
WBC: 19 10*3/uL — AB (ref 4.0–10.5)

## 2013-05-26 NOTE — Progress Notes (Signed)
Post Partum Day 1 Subjective: no complaints, voiding and tolerating PO  Objective: Blood pressure 143/87, pulse 87, temperature 97.8 F (36.6 C), temperature source Oral, resp. rate 18, height 5\' 3"  (1.6 m), weight 120.112 kg (264 lb 12.8 oz), last menstrual period 08/31/2012, SpO2 99.00%, unknown if currently breastfeeding.  Physical Exam:  General: alert, cooperative, appears stated age and no distress Lochia: appropriate Uterine Fundus: firm Incision:  DVT Evaluation: No evidence of DVT seen on physical exam.   Recent Labs  05/25/13 0908 05/26/13 0505  HGB 10.9* 8.9*  HCT 32.8* 26.0*    Assessment/Plan: Plan for discharge tomorrow and Breastfeeding S/P Mag with good diuresis S/P D&C - stable with little bleeding.  Elevated WBC and Hgb 8.9.  No fever and nontender Recheck CBC tomorrow   LOS: 2 days   Paige Vanderwoude C 05/26/2013, 9:57 AM

## 2013-05-26 NOTE — Anesthesia Postprocedure Evaluation (Signed)
  Anesthesia Post-op Note  Patient: Brandi CarrelJulie [name]  Procedure(s) Performed: Procedure(s): DILATATION AND EVACUATION (N/A)  Patient Location: ICU  Anesthesia Type:Epidural  Level of Consciousness: awake, alert  and oriented  Airway and Oxygen Therapy: Patient Spontanous Breathing  Post-op Pain: none  Post-op Assessment: Post-op Vital signs reviewed, Patient's Cardiovascular Status Stable, Respiratory Function Stable, No headache, No backache, No residual numbness and No residual motor weakness  Post-op Vital Signs: Reviewed and stable  Complications: No apparent anesthesia complications

## 2013-05-26 NOTE — Anesthesia Postprocedure Evaluation (Deleted)
  Anesthesia Post-op Note  Patient: Brandi CarrelJulie [name]  Procedure(s) Performed: Procedure(s): DILATATION AND EVACUATION (N/A)  Patient Location: [facility]  Anesthesia Type:Epidural  Level of Consciousness: awake, alert  and oriented  Airway and Oxygen Therapy: Patient Spontanous Breathing  Post-op Pain: none  Post-op Assessment: Post-op Vital signs reviewed, Patient's Cardiovascular Status Stable, Respiratory Function Stable, Pain level controlled, No headache, No backache, No residual numbness and No residual motor weakness  Post-op Vital Signs: Reviewed and stable  Complications: No apparent anesthesia complications

## 2013-05-26 NOTE — Lactation Note (Signed)
This note was copied from the chart of Girl Jaci CarrelJulie Tugwell. Lactation Consultation Note   Follow up consult with this mom had mom sit in chair, position her and baby for football hold, baby nipple to nose, and waited for baby to root to latch. Mom felt some pinching, and I found baby's bottom lip pulled in, I showed dad how to help by pulling down on baby's jaw, and her lip popped out, and mom was able to feel the difference. Mom also knows to [facility] and relatch if latch uncomfortable. Baby is able to latch very well. Baby pulling back mid feed at times. I told mom this should get better tomorrow, when her milk transitions in. Mom knows to call for questions/concerns.  Patient Name: Girl Jaci CarrelJulie Deason UJWJX'BToday's Date: 05/26/2013 Reason for consult: Follow-up assessment   Maternal Data    Feeding Feeding Type: Breast Fed  LATCH Score/Interventions Latch: Grasps breast easily, tongue down, lips flanged, rhythmical sucking. (bottom lip pulled out - I showed dad how to assist with this) Intervention(s): Adjust position;Assist with latch  Audible Swallowing: A few with stimulation Intervention(s): Skin to skin;Hand expression  Type of Nipple: Everted at rest and after stimulation  Comfort (Breast/Nipple): Soft / non-tender     Hold (Positioning): Assistance needed to correctly position infant at breast and maintain latch. Intervention(s): Breastfeeding basics reviewed;Support Pillows;Position options;Skin to skin  LATCH Score: 8  Lactation Tools Discussed/Used     Consult Status Consult Status: Follow-up Follow-up type: Call as needed    Alfred LevinsLee, Lyonel Morejon Anne 05/26/2013, 10:25 AM

## 2013-05-26 NOTE — Op Note (Signed)
NAMJaci [name]:  [name], Brandi                ACCOUNT NO.:  [id number]  MEDICAL RECORD NO.:  [id number]  LOCATION:  9373                          FACILITY:  [facility]  PHYSICIAN:  Guy SandiferJames E. Henderson Cloudomblin, M.D. DATE OF BIRTH:  06-May-1980  DATE OF PROCEDURE:  05/25/2013 DATE OF DISCHARGE:                              OPERATIVE REPORT   PREOPERATIVE DIAGNOSIS:  Postpartum hemorrhage.  POSTOPERATIVE DIAGNOSIS:  Postpartum hemorrhage.  PROCEDURE:  Dilation and evacuation.  SURGEON:  Guy SandiferJames E. Henderson Cloudomblin, MD  ANESTHESIA:  Epidural, Angelica PouMichael A Foster, MD  ESTIMATED BLOOD LOSS:  250 mL.  SPECIMENS:  Endometrial curettage to Pathology.  INDICATIONS AND CONSENT:  This patient is a 33 year old patient who had a spontaneous vaginal delivery for a viable baby.  The placenta had a partial accreta and was stuck on a small portion on the top of the endometrial cavity.  It was manually extracted.  The cavity felt clean at that time.  The uterus firmed up nicely postpartum.  However, she continued to have bleeding that was enough to form a steady, but small, stream down the perineum.  The uterus remained firm and I was unable to explore the upper endometrial cavity.  Therefore, I recommended a dilation and curettage.  Potential risks and complications were reviewed with the patient and her husband preoperatively including, not limited to, infection, uterine perforation, organ damage, bleeding requiring transfusion of blood products with HIV and hepatitis acquisition, DVT, PE, pneumonia, laparoscopy, laparotomy, intrauterine synechiae, and secondary infertility.  All questions were answered and consent was signed on the chart.  DESCRIPTION OF PROCEDURE:  The patient was taken to the operating room where she was identified.  Her epidural anesthetic was augmented to a surgical level by Dr. Malen GauzeFoster, and she was placed in a dorsal supine position.  She was then placed in a dorsal lithotomy position.  Time-out was  undertaken.  She was prepped and draped in a sterile fashion.  Foley catheter was removed.  Bivalve speculum was placed.  Anterior cervix was grasped with a ring clamp.  Gentle sharp curettage with a large curette followed by gentle suction curettage with an 8 suction curette was then carried out.  Small amount of tissue was obtained. Good hemostasis was noted.  Small amount of tissue was obtained. Instruments were removed.  All counts were correct.  The patient was then taken to the recovery room in stable condition.     Guy SandiferJames E. Henderson Cloudomblin, M.D.     JET/MEDQ  D:  05/25/2013  T:  05/26/2013  Job:  [id number]

## 2013-05-26 NOTE — Anesthesia Postprocedure Evaluation (Deleted)
  Anesthesia Post-op Note  Patient: Brandi CarrelJulie [name]  Procedure(s) Performed: * No procedures listed *  Patient Location: PACU and Mother/Baby  Anesthesia Type:Epidural  Level of Consciousness: awake, alert  and oriented  Airway and Oxygen Therapy: Patient Spontanous Breathing  Post-op Pain: none  Post-op Assessment: Post-op Vital signs reviewed, Patient's Cardiovascular Status Stable, No headache, No backache, No residual numbness and No residual motor weakness  Post-op Vital Signs: Reviewed and stable  Complications: No apparent anesthesia complications

## 2013-05-26 NOTE — Anesthesia Postprocedure Evaluation (Signed)
  Anesthesia Post-op Note  Anesthesia Post Note  Patient: Brandi [name]  Procedure(s) Performed: Procedure(s) (LRB): DILATATION AND EVACUATION (N/A)  Anesthesia type: Epidural  Patient location: PACU  Post pain: Pain level controlled  Post assessment: Post-op Vital signs reviewed  Post vital signs: stable  Level of consciousness: awake  Complications: No apparent anesthesia complications

## 2013-05-27 ENCOUNTER — Encounter (HOSPITAL_COMMUNITY): Payer: Self-pay | Admitting: Obstetrics and Gynecology

## 2013-05-27 LAB — CBC
HCT: 25 % — ABNORMAL LOW (ref 36.0–46.0)
HEMOGLOBIN: 8.3 g/dL — AB (ref 12.0–15.0)
MCH: 29.4 pg (ref 26.0–34.0)
MCHC: 33.2 g/dL (ref 30.0–36.0)
MCV: 88.7 fL (ref 78.0–100.0)
Platelets: 217 10*3/uL (ref 150–400)
RBC: 2.82 MIL/uL — ABNORMAL LOW (ref 3.87–5.11)
RDW: 15 % (ref 11.5–15.5)
WBC: 11.7 10*3/uL — ABNORMAL HIGH (ref 4.0–10.5)

## 2013-05-27 MED ORDER — IBUPROFEN 600 MG PO TABS: 600.0000 mg | ORAL_TABLET | Freq: Four times a day (QID) | ORAL | Status: DC

## 2013-05-27 MED ORDER — FERROUS SULFATE 325 (65 FE) MG PO TABS: 325.0000 mg | ORAL_TABLET | Freq: Three times a day (TID) | ORAL | Status: DC

## 2013-05-27 MED ORDER — OXYCODONE-ACETAMINOPHEN 5-325 MG PO TABS: 1.0000 | ORAL_TABLET | ORAL | Status: DC | PRN

## 2013-05-27 NOTE — Discharge Summary (Signed)
Obstetric Discharge Summary Reason for Admission: onset of labor Prenatal Procedures: ultrasound Intrapartum Procedures: spontaneous vaginal delivery and curettage Postpartum Procedures: curettage Complications-Operative and Postpartum: 2 degree perineal laceration Hemoglobin  Date Value Range Status  05/27/2013 8.3* 12.0 - 15.0 g/dL Final     HCT  Date Value Range Status  05/27/2013 25.0* 36.0 - 46.0 % Final    Physical Exam:  General: alert and cooperative Lochia: appropriate Uterine Fundus: firm Incision: perineum intact DVT Evaluation: No evidence of DVT seen on physical exam. Negative Homan's sign. No cords or calf tenderness.  Discharge Diagnoses: Term Pregnancy-delivered  Discharge Information: Date: 05/27/2013 Activity: pelvic rest Diet: routine Medications: PNV, Ibuprofen, Iron and Percocet Condition: stable Instructions: refer to practice specific booklet Discharge to: home   Newborn Data: Live born female  Birth Weight: 7 lb 3.9 oz (3285 g) APGAR: 8, 9  Home with mother.  Soham Hollett G 05/27/2013, 9:13 AM

## 2013-05-27 NOTE — Progress Notes (Signed)
Pt  Ambulated well out with baby and father   Teaching complete

## 2013-06-27 ENCOUNTER — Other Ambulatory Visit: Payer: Self-pay | Admitting: Obstetrics and Gynecology

## 2014-02-20 ENCOUNTER — Encounter (HOSPITAL_COMMUNITY): Payer: Self-pay | Admitting: Obstetrics and Gynecology

## 2015-08-22 DIAGNOSIS — E669 Obesity, unspecified: Secondary | ICD-10-CM | POA: Insufficient documentation

## 2015-08-22 DIAGNOSIS — F419 Anxiety disorder, unspecified: Secondary | ICD-10-CM | POA: Insufficient documentation

## 2015-08-22 DIAGNOSIS — F17200 Nicotine dependence, unspecified, uncomplicated: Secondary | ICD-10-CM | POA: Insufficient documentation

## 2015-08-22 DIAGNOSIS — E039 Hypothyroidism, unspecified: Secondary | ICD-10-CM | POA: Insufficient documentation

## 2017-06-09 DIAGNOSIS — J0100 Acute maxillary sinusitis, unspecified: Secondary | ICD-10-CM | POA: Insufficient documentation

## 2017-06-09 DIAGNOSIS — J029 Acute pharyngitis, unspecified: Secondary | ICD-10-CM | POA: Insufficient documentation

## 2018-01-26 ENCOUNTER — Other Ambulatory Visit: Payer: Self-pay | Admitting: Family Medicine

## 2018-01-26 ENCOUNTER — Ambulatory Visit
Admission: RE | Admit: 2018-01-26 | Discharge: 2018-01-26 | Disposition: A | Discharge status: Home / Self Care | Payer: 59 | Source: Ambulatory Visit | Attending: Family Medicine | Admitting: Family Medicine

## 2018-01-26 DIAGNOSIS — M79672 Pain in left foot: Secondary | ICD-10-CM

## 2018-05-13 ENCOUNTER — Other Ambulatory Visit: Payer: Self-pay | Admitting: Family Medicine

## 2018-05-13 DIAGNOSIS — M79672 Pain in left foot: Secondary | ICD-10-CM

## 2018-05-13 DIAGNOSIS — M7742 Metatarsalgia, left foot: Secondary | ICD-10-CM

## 2018-05-22 ENCOUNTER — Ambulatory Visit
Admission: RE | Admit: 2018-05-22 | Discharge: 2018-05-22 | Disposition: A | Discharge status: Home / Self Care | Payer: 59 | Source: Ambulatory Visit | Attending: Family Medicine | Admitting: Family Medicine

## 2018-05-22 DIAGNOSIS — M79672 Pain in left foot: Secondary | ICD-10-CM

## 2018-05-22 DIAGNOSIS — M7742 Metatarsalgia, left foot: Secondary | ICD-10-CM

## 2018-07-31 ENCOUNTER — Emergency Department (HOSPITAL_COMMUNITY): Payer: 59

## 2018-07-31 ENCOUNTER — Other Ambulatory Visit: Payer: Self-pay

## 2018-07-31 ENCOUNTER — Observation Stay (HOSPITAL_COMMUNITY)
Admission: EM | Admit: 2018-07-31 | Discharge: 2018-08-02 | Disposition: A | Discharge status: Home / Self Care | Payer: 59 | Attending: Surgery | Admitting: Surgery

## 2018-07-31 ENCOUNTER — Encounter (HOSPITAL_COMMUNITY): Payer: Self-pay | Admitting: *Deleted

## 2018-07-31 DIAGNOSIS — Z6841 Body Mass Index (BMI) 40.0 and over, adult: Secondary | ICD-10-CM | POA: Insufficient documentation

## 2018-07-31 DIAGNOSIS — K828 Other specified diseases of gallbladder: Secondary | ICD-10-CM | POA: Diagnosis not present

## 2018-07-31 DIAGNOSIS — R1011 Right upper quadrant pain: Secondary | ICD-10-CM

## 2018-07-31 DIAGNOSIS — Z882 Allergy status to sulfonamides status: Secondary | ICD-10-CM | POA: Diagnosis not present

## 2018-07-31 DIAGNOSIS — Z8349 Family history of other endocrine, nutritional and metabolic diseases: Secondary | ICD-10-CM | POA: Diagnosis not present

## 2018-07-31 DIAGNOSIS — Z8249 Family history of ischemic heart disease and other diseases of the circulatory system: Secondary | ICD-10-CM | POA: Diagnosis not present

## 2018-07-31 DIAGNOSIS — K8010 Calculus of gallbladder with chronic cholecystitis without obstruction: Secondary | ICD-10-CM | POA: Diagnosis not present

## 2018-07-31 DIAGNOSIS — Z87891 Personal history of nicotine dependence: Secondary | ICD-10-CM | POA: Insufficient documentation

## 2018-07-31 DIAGNOSIS — E039 Hypothyroidism, unspecified: Secondary | ICD-10-CM | POA: Insufficient documentation

## 2018-07-31 DIAGNOSIS — Z808 Family history of malignant neoplasm of other organs or systems: Secondary | ICD-10-CM | POA: Insufficient documentation

## 2018-07-31 DIAGNOSIS — K819 Cholecystitis, unspecified: Secondary | ICD-10-CM

## 2018-07-31 DIAGNOSIS — K810 Acute cholecystitis: Secondary | ICD-10-CM | POA: Diagnosis present

## 2018-07-31 DIAGNOSIS — Z79899 Other long term (current) drug therapy: Secondary | ICD-10-CM | POA: Insufficient documentation

## 2018-07-31 DIAGNOSIS — I10 Essential (primary) hypertension: Secondary | ICD-10-CM | POA: Insufficient documentation

## 2018-07-31 DIAGNOSIS — Z881 Allergy status to other antibiotic agents status: Secondary | ICD-10-CM | POA: Insufficient documentation

## 2018-07-31 LAB — CBC WITH DIFFERENTIAL/PLATELET
Abs Immature Granulocytes: 0.04 10*3/uL (ref 0.00–0.07)
Basophils Absolute: 0.1 10*3/uL (ref 0.0–0.1)
Basophils Relative: 0 %
Eosinophils Absolute: 0.2 10*3/uL (ref 0.0–0.5)
Eosinophils Relative: 1 %
HCT: 46.6 % — ABNORMAL HIGH (ref 36.0–46.0)
Hemoglobin: 15.1 g/dL — ABNORMAL HIGH (ref 12.0–15.0)
Immature Granulocytes: 0 %
Lymphocytes Relative: 22 %
Lymphs Abs: 3.2 10*3/uL (ref 0.7–4.0)
MCH: 29.3 pg (ref 26.0–34.0)
MCHC: 32.4 g/dL (ref 30.0–36.0)
MCV: 90.5 fL (ref 80.0–100.0)
Monocytes Absolute: 0.7 10*3/uL (ref 0.1–1.0)
Monocytes Relative: 5 %
Neutro Abs: 10.5 10*3/uL — ABNORMAL HIGH (ref 1.7–7.7)
Neutrophils Relative %: 72 %
Platelets: 313 10*3/uL (ref 150–400)
RBC: 5.15 MIL/uL — ABNORMAL HIGH (ref 3.87–5.11)
RDW: 13.1 % (ref 11.5–15.5)
WBC: 14.7 10*3/uL — ABNORMAL HIGH (ref 4.0–10.5)
nRBC: 0 % (ref 0.0–0.2)

## 2018-07-31 LAB — I-STAT BETA HCG BLOOD, ED (MC, WL, AP ONLY): I-stat hCG, quantitative: <5 m[IU]/mL (ref <5)

## 2018-07-31 LAB — LIPASE, BLOOD: Lipase: 25 U/L (ref 11–51)

## 2018-07-31 LAB — URINALYSIS, ROUTINE W REFLEX MICROSCOPIC
Bilirubin Urine: NEGATIVE
Glucose, UA: NEGATIVE mg/dL
Hgb urine dipstick: NEGATIVE
Ketones, ur: NEGATIVE mg/dL
Leukocytes,Ua: NEGATIVE
Nitrite: NEGATIVE
Protein, ur: NEGATIVE mg/dL
Specific Gravity, Urine: 1.004 — ABNORMAL LOW (ref 1.005–1.030)
pH: 7 (ref 5.0–8.0)

## 2018-07-31 LAB — COMPREHENSIVE METABOLIC PANEL
ALT: 23 U/L (ref 0–44)
AST: 20 U/L (ref 15–41)
Albumin: 3.6 g/dL (ref 3.5–5.0)
Alkaline Phosphatase: 117 U/L (ref 38–126)
Anion gap: 12 (ref 5–15)
BUN: 5 mg/dL — ABNORMAL LOW (ref 6–20)
CO2: 26 mmol/L (ref 22–32)
Calcium: 9.5 mg/dL (ref 8.9–10.3)
Chloride: 100 mmol/L (ref 98–111)
Creatinine, Ser: 0.89 mg/dL (ref 0.44–1.00)
GFR calc Af Amer: >60 mL/min (ref >60)
GFR calc non Af Amer: >60 mL/min (ref >60)
Glucose, Bld: 117 mg/dL — ABNORMAL HIGH (ref 70–99)
Potassium: 3.8 mmol/L (ref 3.5–5.1)
Sodium: 138 mmol/L (ref 135–145)
Total Bilirubin: 0.5 mg/dL (ref 0.3–1.2)
Total Protein: 6.9 g/dL (ref 6.5–8.1)

## 2018-07-31 LAB — SURGICAL PCR SCREEN
MRSA, PCR: NEGATIVE
Staphylococcus aureus: NEGATIVE

## 2018-07-31 MED ORDER — DIPHENHYDRAMINE HCL 50 MG/ML IJ SOLN: 25.0000 mg | Freq: Four times a day (QID) | INTRAMUSCULAR | Status: DC | PRN

## 2018-07-31 MED ORDER — LACTATED RINGERS IV SOLN: INTRAVENOUS | Status: DC

## 2018-07-31 MED ORDER — HYDRALAZINE HCL 20 MG/ML IJ SOLN: 10.0000 mg | INTRAMUSCULAR | Status: DC | PRN

## 2018-07-31 MED ORDER — HYDROMORPHONE HCL 1 MG/ML IJ SOLN: 0.5000 mg | INTRAMUSCULAR | Status: DC | PRN

## 2018-07-31 MED ORDER — ONDANSETRON HCL 4 MG/2ML IJ SOLN
4.0000 mg | Freq: Once | INTRAMUSCULAR | Status: AC
  Administered 2018-07-31: 15:00:00 4 mg via INTRAVENOUS
  Filled 2018-07-31: qty 2

## 2018-07-31 MED ORDER — DIPHENHYDRAMINE HCL 25 MG PO CAPS: 25.0000 mg | ORAL_CAPSULE | Freq: Four times a day (QID) | ORAL | Status: DC | PRN

## 2018-07-31 MED ORDER — TRAMADOL HCL 50 MG PO TABS: 50.0000 mg | ORAL_TABLET | Freq: Four times a day (QID) | ORAL | Status: DC | PRN

## 2018-07-31 MED ORDER — SODIUM CHLORIDE 0.9 % IV SOLN
2.0000 g | INTRAVENOUS | Status: DC
  Administered 2018-07-31 – 2018-08-01 (×2): 2 g via INTRAVENOUS
  Filled 2018-07-31 (×3): qty 20

## 2018-07-31 MED ORDER — ONDANSETRON HCL 4 MG/2ML IJ SOLN
4.0000 mg | Freq: Four times a day (QID) | INTRAMUSCULAR | Status: DC | PRN
  Administered 2018-07-31 – 2018-08-01 (×2): 4 mg via INTRAVENOUS
  Filled 2018-07-31: qty 2

## 2018-07-31 MED ORDER — OXYCODONE HCL 5 MG PO TABS: 5.0000 mg | ORAL_TABLET | ORAL | Status: DC | PRN

## 2018-07-31 MED ORDER — BISACODYL 10 MG RE SUPP: 10.0000 mg | Freq: Every day | RECTAL | Status: DC | PRN

## 2018-07-31 MED ORDER — ACETAMINOPHEN 500 MG PO TABS
1000.0000 mg | ORAL_TABLET | Freq: Four times a day (QID) | ORAL | Status: DC
  Administered 2018-07-31 – 2018-08-01 (×3): 1000 mg via ORAL
  Filled 2018-07-31 (×6): qty 2

## 2018-07-31 MED ORDER — SODIUM CHLORIDE 0.9 % IV BOLUS
500.0000 mL | Freq: Once | INTRAVENOUS | Status: AC
  Administered 2018-07-31: 15:00:00 500 mL via INTRAVENOUS

## 2018-07-31 MED ORDER — METOPROLOL TARTRATE 5 MG/5ML IV SOLN: 5.0000 mg | Freq: Four times a day (QID) | INTRAVENOUS | Status: DC | PRN

## 2018-07-31 MED ORDER — DOCUSATE SODIUM 100 MG PO CAPS
100.0000 mg | ORAL_CAPSULE | Freq: Two times a day (BID) | ORAL | Status: DC
  Administered 2018-07-31 – 2018-08-02 (×3): 100 mg via ORAL
  Filled 2018-07-31 (×5): qty 1

## 2018-07-31 MED ORDER — KETOROLAC TROMETHAMINE 15 MG/ML IJ SOLN: 15.0000 mg | Freq: Four times a day (QID) | INTRAMUSCULAR | Status: DC | PRN

## 2018-07-31 MED ORDER — SODIUM CHLORIDE 0.9 % IV SOLN
INTRAVENOUS | Status: DC
  Administered 2018-07-31 (×2): via INTRAVENOUS

## 2018-07-31 MED ORDER — ENOXAPARIN SODIUM 40 MG/0.4ML ~~LOC~~ SOLN
40.0000 mg | SUBCUTANEOUS | Status: DC
  Administered 2018-07-31 – 2018-08-01 (×2): 40 mg via SUBCUTANEOUS
  Filled 2018-07-31 (×2): qty 0.4

## 2018-07-31 MED ORDER — GABAPENTIN 300 MG PO CAPS
300.0000 mg | ORAL_CAPSULE | Freq: Three times a day (TID) | ORAL | Status: DC
  Administered 2018-07-31 – 2018-08-02 (×5): 300 mg via ORAL
  Filled 2018-07-31 (×6): qty 1

## 2018-07-31 MED ORDER — PANTOPRAZOLE SODIUM 40 MG IV SOLR
40.0000 mg | Freq: Every day | INTRAVENOUS | Status: DC
  Administered 2018-07-31 – 2018-08-01 (×2): 40 mg via INTRAVENOUS
  Filled 2018-07-31 (×2): qty 40

## 2018-07-31 MED ORDER — ONDANSETRON 4 MG PO TBDP: 4.0000 mg | ORAL_TABLET | Freq: Four times a day (QID) | ORAL | Status: DC | PRN

## 2018-07-31 MED ORDER — METHOCARBAMOL 500 MG PO TABS: 500.0000 mg | ORAL_TABLET | Freq: Four times a day (QID) | ORAL | Status: DC | PRN

## 2018-07-31 MED ORDER — MORPHINE SULFATE (PF) 4 MG/ML IV SOLN
4.0000 mg | Freq: Once | INTRAVENOUS | Status: AC
  Administered 2018-07-31: 15:00:00 4 mg via INTRAVENOUS
  Filled 2018-07-31: qty 1

## 2018-07-31 NOTE — H&P (Signed)
Surgical H&P  CC: abdominal pain  HPI: This is a very pleasant and relatively healthy 38 year old woman who came to the emergency department with abdominal pain.  This began yesterday evening after dinner (she had fish and potatoes) and is a severe burning pain which is predominant over the epigastrium and subxiphoid location as well as a severe pain in the right subcostal margin.  She did have some associated vomiting this morning and diarrhea.  Pain is constant.  Aggravated by movement.  Denies any fevers, melena, hematochezia, or hematemesis.  No known sick contacts, and no one else became ill after eating the same food.  She went to urgent care this morning and was treated with Pepcid but her symptoms continued and worsened and thus she presents to the ER. She has not had any prior abdominal surgery.  Allergies  Allergen Reactions  . Augmentin [Amoxicillin-Pot Clavulanate] Nausea And Vomiting  . Sulfa Antibiotics Hives    Past Medical History:  Diagnosis Date  . Hypertension    GHTN  . Obesity   . Thyroid disease     Past Surgical History:  Procedure Laterality Date  . DILATION AND EVACUATION N/A 05/25/2013   Procedure: DILATATION AND EVACUATION;  Surgeon: Leslie AndreaJames E Tomblin II, MD;  Location: [facility];  Service: Gynecology;  Laterality: N/A;  . GANGLION CYST EXCISION    . WISDOM TOOTH EXTRACTION      Family History  Problem Relation Age of Onset  . Hypertension Mother   . Thyroid disease Mother   . Hypertension Father   . Cancer Paternal Grandfather        skin cancer    Social History   Socioeconomic History  . Marital status: Married    Spouse name: Not on file  . Number of children: Not on file  . Years of education: Not on file  . Highest education level: Not on file  Occupational History  . Not on file  Social Needs  . Financial resource strain: Not on file  . Food insecurity:    Worry: Not on file    Inability: Not on file  . Transportation needs:    Medical:  Not on file    Non-medical: Not on file  Tobacco Use  . Smoking status: Former Smoker    Packs/day: 0.50    Types: Cigarettes    Last attempt to quit: 09/19/2012    Years since quitting: 5.8  . Smokeless tobacco: Never Used  Substance and Sexual Activity  . Alcohol use: No  . Drug use: No  . Sexual activity: Yes    Birth control/protection: None  Lifestyle  . Physical activity:    Days per week: Not on file    Minutes per session: Not on file  . Stress: Not on file  Relationships  . Social connections:    Talks on phone: Not on file    Gets together: Not on file    Attends religious service: Not on file    Active member of club or organization: Not on file    Attends meetings of clubs or organizations: Not on file    Relationship status: Not on file  Other Topics Concern  . Not on file  Social History Narrative  . Not on file    No current facility-administered medications on file prior to encounter.    Current Outpatient Medications on File Prior to Encounter  Medication Sig Dispense Refill  . ferrous sulfate 325 (65 FE) MG tablet Take 1 tablet (  325 mg total) by mouth 3 (three) times daily with meals. 90 tablet 3  . ibuprofen (ADVIL,MOTRIN) 600 MG tablet Take 1 tablet (600 mg total) by mouth every 6 (six) hours. 30 tablet 0  . levothyroxine (SYNTHROID, LEVOTHROID) 88 MCG tablet Take 88 mcg by mouth daily before breakfast.    . oxyCODONE-acetaminophen (PERCOCET/ROXICET) 5-325 MG per tablet Take 1-2 tablets by mouth every 4 (four) hours as needed for severe pain (moderate - severe pain). 30 tablet 0  . Prenatal Vit-Min-FA-Fish Oil ([employer] PRENATAL GUMMY PO) Take 2 tablets by mouth daily.      Review of Systems: a complete, 10pt review of systems was completed with pertinent positives and negatives as documented in the HPI  Physical Exam: Vitals:   07/31/18 1316  BP: (!) 143/93  Pulse: 83  Resp: 16  Temp: 98.5 F (36.9 C)  SpO2: 97%   Gen: A&Ox3, no distress  Head:  normocephalic, atraumatic Eyes: extraocular motions intact, anicteric.  Neck: supple without mass or thyromegaly Chest: unlabored respirations, symmetrical air entry, clear bilaterally   Cardiovascular: RRR with palpable distal pulses, no pedal edema Abdomen: soft, obese, tenderness in the epigastrium, there is tenderness with voluntary guarding in the right subcostal field. No mass or organomegaly.  Extremities: warm, without edema, no deformities  Neuro: grossly intact Psych: appropriate mood and affect, normal insight  Skin: warm and dry   CBC Latest Ref Rng & Units 07/31/2018 05/27/2013 05/26/2013  WBC 4.0 - 10.5 K/uL 14.7(H) 11.7(H) 19.0(H)  Hemoglobin 12.0 - 15.0 g/dL 15.1(H) 8.3(L) 8.9(L)  Hematocrit 36.0 - 46.0 % 46.6(H) 25.0(L) 26.0(L)  Platelets 150 - 400 K/uL 313 217 222    CMP Latest Ref Rng & Units 07/31/2018 05/25/2013 05/25/2013  Glucose 70 - 99 mg/dL 454(U) 981(X) 914(N)  BUN 6 - 20 mg/dL 5(L) 10 11  Creatinine 0.44 - 1.00 mg/dL 8.29 5.62 1.30  Sodium 135 - 145 mmol/L 138 133(L) 137  Potassium 3.5 - 5.1 mmol/L 3.8 4.8 4.8  Chloride 98 - 111 mmol/L 100 100 102  CO2 22 - 32 mmol/L Calcium 8.9 - 10.3 mg/dL 9.5 8.6(V) 8.8  Total Protein 6.5 - 8.1 g/dL 6.9 7.8(I) 6.0  Total Bilirubin 0.3 - 1.2 mg/dL 0.5 6.9(G) 2.9(B)  Alkaline Phos 38 - 126 U/L 117 306(H) 337(H)  AST 15 - 41 U/L 20 70(H) 83(H)  ALT 0 - 44 U/L 23 102(H) 124(H)    No results found for: INR, PROTIME  Imaging: US Abdomen Limited Ruq  Result Date: 07/31/2018 CLINICAL DATA:  Right upper quadrant abdominal pain for 1 day. History of hypertension. EXAM: ULTRASOUND ABDOMEN LIMITED RIGHT UPPER QUADRANT COMPARISON:  None. FINDINGS: Gallbladder: Several gallstones are noted. There is borderline gallbladder wall thickening (3.5 mm) and no pericholecystic fluid. The patient is reported to have a positive sonographic Murphy sign according to the sonographer. Common bile duct: Diameter: 2 mm Liver: Borderline  increased hepatic echogenicity without focal abnormality. Portal vein is patent on color Doppler imaging with normal direction of blood flow towards the liver. IMPRESSION: 1. Cholelithiasis with borderline gallbladder wall thickening and reported positive sonographic Murphy sign. Cannot exclude early cholecystitis. 2. No biliary dilatation. Electronically Signed   By: Carey Bullocks M.D.   On: 07/31/2018 14:57    A/P: Cholecystitis clinically. I recommend proceeding with laparoscopic cholecystectomy with possible cholangiogram. Discussed risks of surgery including bleeding, pain, scarring, intraabdominal injury specifically to the common bile duct and sequelae, bile leak, conversion to open surgery,  blood clot, pneumonia, heart attack, stroke, failure to resolve symptoms, etc. Questions welcomed and answered.  Will admit for pain control, empiric antibiotics, and IV fluids and plan for surgery tomorrow with Dr. Cliffton Asters.    Phylliss Blakes, MD [facility] [place] Pager [phone]

## 2018-07-31 NOTE — ED Notes (Signed)
ED TO INPATIENT HANDOFF REPORT  ED Nurse Name and Phone #: Irving Burton [phone]    S Name/Age/Gender Brandi [name] 38 y.o. female Room/Bed: 004C/004C  Code Status   Code Status: Full Code  Home/SNF/Other Home Patient oriented to: self, place, time and situation Is this baseline? Yes   Triage Complete: Triage complete  Chief Complaint flank pain  Triage Note PT seen at [facility] this Am for same problem . Pt was sent home and instructed  To come to Ed for Sx's that did not improve.   Allergies Allergies  Allergen Reactions  . Augmentin [Amoxicillin-Pot Clavulanate] Nausea And Vomiting  . Sulfa Antibiotics Hives    Level of Care/Admitting Diagnosis ED Disposition    ED Disposition Condition Comment   Admit  Hospital Area: [facility] [[id number]]  Level of Care: Med-Surg [16]  Diagnosis: Acute cholecystitis [575.0.ICD-9-CM]  Admitting Physician: CCS, MD [[id number]]  Attending Physician: CCS, MD [[id number]]  Bed request comments: 6N  PT Class (Do Not Modify): Observation [104]  PT Acc Code (Do Not Modify): Observation [10022]       B Medical/Surgery History Past Medical History:  Diagnosis Date  . Hypertension    GHTN  . Obesity   . Thyroid disease    Past Surgical History:  Procedure Laterality Date  . DILATION AND EVACUATION N/A 05/25/2013   Procedure: DILATATION AND EVACUATION;  Surgeon: Leslie Andrea, MD;  Location: [facility];  Service: Gynecology;  Laterality: N/A;  . GANGLION CYST EXCISION    . WISDOM TOOTH EXTRACTION       A IV Location/Drains/Wounds Patient Lines/Drains/Airways Status   Active Line/Drains/Airways    Name:   Placement date:   Placement time:   Site:   Days:   Peripheral IV 07/31/18 Left Antecubital   07/31/18    1437    Antecubital   less than 1   Incision 05/25/13 Vagina Other (Comment)   05/25/13    0711     1893          Intake/Output Last 24 hours  Intake/Output Summary (Last 24 hours) at 07/31/2018 1622 Last data  filed at 07/31/2018 1618 Gross per 24 hour  Intake 500 ml  Output -  Net 500 ml    Labs/Imaging Results for orders placed or performed during the hospital encounter of 07/31/18 (from the past 48 hour(s))  Urinalysis, Routine w reflex microscopic     Status: Abnormal   Collection Time: 07/31/18  1:44 PM  Result Value Ref Range   Color, Urine STRAW (A) YELLOW   APPearance CLEAR CLEAR   Specific Gravity, Urine 1.004 (L) 1.005 - 1.030   pH 7.0 5.0 - 8.0   Glucose, UA NEGATIVE NEGATIVE mg/dL   Hgb urine dipstick NEGATIVE NEGATIVE   Bilirubin Urine NEGATIVE NEGATIVE   Ketones, ur NEGATIVE NEGATIVE mg/dL   Protein, ur NEGATIVE NEGATIVE mg/dL   Nitrite NEGATIVE NEGATIVE   Leukocytes,Ua NEGATIVE NEGATIVE    Comment: Performed at [facility], [address], [place], [place] [address]  Comprehensive metabolic panel     Status: Abnormal   Collection Time: 07/31/18  2:30 PM  Result Value Ref Range   Sodium 138 135 - 145 mmol/L   Potassium 3.8 3.5 - 5.1 mmol/L   Chloride 100 98 - 111 mmol/L   CO2 26 22 - 32 mmol/L   Glucose, Bld 117 (H) 70 - 99 mg/dL   BUN 5 (L) 6 - 20 mg/dL   Creatinine, Ser  0.89 0.44 - 1.00 mg/dL   Calcium 9.5 8.9 - 16.110.3 mg/dL   Total Protein 6.9 6.5 - 8.1 g/dL   Albumin 3.6 3.5 - 5.0 g/dL   AST 20 15 - 41 U/L   ALT 23 0 - 44 U/L   Alkaline Phosphatase 117 38 - 126 U/L   Total Bilirubin 0.5 0.3 - 1.2 mg/dL   GFR calc non Af Amer >60 >60 mL/min   GFR calc Af Amer >60 >60 mL/min   Anion gap 12 5 - 15    Comment: Performed at [facility], [address] [place], [place] [address]  CBC with Differential     Status: Abnormal   Collection Time: 07/31/18  2:30 PM  Result Value Ref Range   WBC 14.7 (H) 4.0 - 10.5 K/uL   RBC 5.15 (H) 3.87 - 5.11 MIL/uL   Hemoglobin 15.1 (H) 12.0 - 15.0 g/dL   HCT 54.046.6 (H) 98.136.0 - 19.146.0 %   MCV 90.5 80.0 - 100.0 fL   MCH 29.3 26.0 - 34.0 pg   MCHC 32.4 30.0 - 36.0 g/dL   RDW 47.813.1 29.511.5 - 62.115.5 %   Platelets 313 150 - 400  K/uL   nRBC 0.0 0.0 - 0.2 %   Neutrophils Relative % 72 %   Neutro Abs 10.5 (H) 1.7 - 7.7 K/uL   Lymphocytes Relative 22 %   Lymphs Abs 3.2 0.7 - 4.0 K/uL   Monocytes Relative 5 %   Monocytes Absolute 0.7 0.1 - 1.0 K/uL   Eosinophils Relative 1 %   Eosinophils Absolute 0.2 0.0 - 0.5 K/uL   Basophils Relative 0 %   Basophils Absolute 0.1 0.0 - 0.1 K/uL   Immature Granulocytes 0 %   Abs Immature Granulocytes 0.04 0.00 - 0.07 K/uL    Comment: Performed at [facility], [address] [place], [place] [address]  Lipase, blood     Status: None   Collection Time: 07/31/18  2:30 PM  Result Value Ref Range   Lipase 25 11 - 51 U/L    Comment: Performed at [facility], [address] [place], [place] [address]  I-Stat beta hCG blood, ED     Status: None   Collection Time: 07/31/18  2:35 PM  Result Value Ref Range   I-stat hCG, quantitative <5.0 <5 mIU/mL   Comment 3            Comment:   GEST. AGE      CONC.  (mIU/mL)   <=1 WEEK        5 - 50     2 WEEKS       50 - 500     3 WEEKS       100 - 10,000     4 WEEKS     1,000 - 30,000        FEMALE AND NON-PREGNANT FEMALE:     LESS THAN 5 mIU/mL    [place] Abdomen Limited Ruq  Result Date: 07/31/2018 CLINICAL DATA:  Right upper quadrant abdominal pain for 1 day. History of hypertension. EXAM: ULTRASOUND ABDOMEN LIMITED RIGHT UPPER QUADRANT COMPARISON:  None. FINDINGS: Gallbladder: Several gallstones are noted. There is borderline gallbladder wall thickening (3.5 mm) and no pericholecystic fluid. The patient is reported to have a positive sonographic Murphy sign according to the sonographer. Common bile duct: Diameter: 2 mm Liver: Borderline increased hepatic echogenicity without focal abnormality. Portal vein is patent on color Doppler imaging  with normal direction of blood flow towards the liver. IMPRESSION: 1. Cholelithiasis with borderline gallbladder wall thickening and reported positive sonographic Murphy sign. Cannot exclude early  cholecystitis. 2. No biliary dilatation. Electronically Signed   By: Carey Bullocks M.D.   On: 07/31/2018 14:57    Pending Labs Unresulted Labs (From admission, onward)    Start     Ordered   08/01/18 0500  Comprehensive metabolic panel  Tomorrow morning,   R     07/31/18 1537   08/01/18 0500  CBC  Tomorrow morning,   R     07/31/18 1537   07/31/18 1534  HIV antibody (Routine Testing)  Once,   R     07/31/18 1537          Vitals/Pain Today's Vitals   07/31/18 1316 07/31/18 1325 07/31/18 1619 07/31/18 1620  BP: (!) 143/93  130/79   Pulse: 83  (!) 58   Resp: 16  15   Temp: 98.5 F (36.9 C)     TempSrc: Oral     SpO2: 97%  99%   Weight: 108.4 kg 108.4 kg    Height:  (1.6 m)  (1.6 m)    PainSc:  6   5     Isolation Precautions No active isolations  Medications Medications  enoxaparin (LOVENOX) injection 40 mg (has no administration in time range)  0.9 %  sodium chloride infusion (has no administration in time range)  cefTRIAXone (ROCEPHIN) 2 g in sodium chloride 0.9 % 100 mL IVPB (has no administration in time range)  acetaminophen (TYLENOL) tablet 1,000 mg (has no administration in time range)  ketorolac (TORADOL) 15 MG/ML injection 15 mg (has no administration in time range)  traMADol (ULTRAM) tablet 50 mg (has no administration in time range)  oxyCODONE (Oxy IR/ROXICODONE) immediate release tablet 5-10 mg (has no administration in time range)  HYDROmorphone (DILAUDID) injection 0.5 mg (has no administration in time range)  methocarbamol (ROBAXIN) tablet 500 mg (has no administration in time range)  diphenhydrAMINE (BENADRYL) capsule 25 mg (has no administration in time range)    Or  diphenhydrAMINE (BENADRYL) injection 25 mg (has no administration in time range)  docusate sodium (COLACE) capsule 100 mg (has no administration in time range)  bisacodyl (DULCOLAX) suppository 10 mg (has no administration in time range)  ondansetron (ZOFRAN-ODT) disintegrating  tablet 4 mg (has no administration in time range)    Or  ondansetron (ZOFRAN) injection 4 mg (has no administration in time range)  pantoprazole (PROTONIX) injection 40 mg (has no administration in time range)  metoprolol tartrate (LOPRESSOR) injection 5 mg (has no administration in time range)  hydrALAZINE (APRESOLINE) injection 10 mg (has no administration in time range)  gabapentin (NEURONTIN) capsule 300 mg (has no administration in time range)  sodium chloride 0.9 % bolus 500 mL (0 mLs Intravenous Stopped 07/31/18 1618)  ondansetron (ZOFRAN) injection 4 mg (4 mg Intravenous Given 07/31/18 1438)  morphine 4 MG/ML injection 4 mg (4 mg Intravenous Given 07/31/18 1440)    Mobility walks Low fall risk   Focused Assessments Pulmonary Assessment Handoff:  Lung sounds:   O2 Device: Room Air        R Recommendations: See Admitting Provider Note  Report given to:   Additional Notes

## 2018-07-31 NOTE — ED Provider Notes (Signed)
[facility] EMERGENCY DEPARTMENT Provider Note   CSN: [id number] Arrival date & time: 07/31/18  1307    History   Chief Complaint Chief Complaint  Patient presents with   Abdominal Pain    HPI Brandi [name] is a 38 y.o. female.     The history is provided by the patient and medical records. No language interpreter was used.  Abdominal Pain   Brandi [name] is a 38 y.o. female who presents to the Emergency Department complaining of abdominal pain. She presents to the emergency department complaining of abdominal pain that began last night. She had fish and potatoes for dinner and shortly after eating she developed severe burning abdominal pain that is predominant over her epigastric. This morning she developed associated vomiting and diarrhea. Pain has been constant is worse with laying down as well as with movement. She denies any fevers, dysuria, vaginal discharge. No one else got sick after eating this meal. She denies any known sick contacts. She went to urgent care this morning and was treated with Pepcid. She presents now because of ongoing pain. She has a history of hypothyroidism. No prior abdominal surgeries. She does not drink alcohol. Past Medical History:  Diagnosis Date   Hypertension    GHTN   Obesity    Thyroid disease     Patient Active Problem List   Diagnosis Date Noted   Acute cholecystitis 07/31/2018   Preeclampsia 05/24/2013    Past Surgical History:  Procedure Laterality Date   DILATION AND EVACUATION N/A 05/25/2013   Procedure: DILATATION AND EVACUATION;  Surgeon: Leslie AndreaJames E Tomblin II, MD;  Location: [facility];  Service: Gynecology;  Laterality: N/A;   GANGLION CYST EXCISION     WISDOM TOOTH EXTRACTION       OB History    Gravida  4   Para  2   Term  1   Preterm  1   AB  2   Living  2     SAB  2   TAB      Ectopic      Multiple      Live Births  2            Home Medications    Prior to Admission  medications   Medication Sig Start Date End Date Taking? Authorizing Provider  ferrous sulfate 325 (65 FE) MG tablet Take 1 tablet (325 mg total) by mouth 3 (three) times daily with meals. 05/27/13   Julio Sicksurtis, Carol, NP  ibuprofen (ADVIL,MOTRIN) 600 MG tablet Take 1 tablet (600 mg total) by mouth every 6 (six) hours. 05/27/13   Julio Sicksurtis, Carol, NP  levothyroxine (SYNTHROID, LEVOTHROID) 88 MCG tablet Take 88 mcg by mouth daily before breakfast.    [provider]  oxyCODONE-acetaminophen (PERCOCET/ROXICET) 5-325 MG per tablet Take 1-2 tablets by mouth every 4 (four) hours as needed for severe pain (moderate - severe pain). 05/27/13   Julio Sicksurtis, Carol, NP  Prenatal Vit-Min-FA-Fish Oil ([facility] PRENATAL GUMMY PO) Take 2 tablets by mouth daily.    [provider]    Family History Family History  Problem Relation Age of Onset   Hypertension Mother    Thyroid disease Mother    Hypertension Father    Cancer Paternal Grandfather        skin cancer    Social History Social History   Tobacco Use   Smoking status: Former Smoker    Packs/day: 0.50    Types: Cigarettes    Last attempt  to quit: 09/19/2012    Years since quitting: 5.8   Smokeless tobacco: Never Used  Substance Use Topics   Alcohol use: No   Drug use: No     Allergies   Augmentin [amoxicillin-pot clavulanate] and Sulfa antibiotics   Review of Systems Review of Systems  Gastrointestinal: Positive for abdominal pain.  All other systems reviewed and are negative.    Physical Exam Updated Vital Signs BP 119/83 (BP Location: Right Arm)    Pulse 70    Temp 98.7 F (37.1 C) (Oral)    Resp 15    Ht  (1.6 m)    Wt 108.4 kg    LMP 07/19/2018    SpO2 100%    Breastfeeding No    BMI 42.34 kg/m   Physical Exam Vitals signs and nursing note reviewed.  Constitutional:      Appearance: She is well-developed.  HENT:     Head: Normocephalic and atraumatic.  Cardiovascular:     Rate and Rhythm: Normal rate and  regular rhythm.  Pulmonary:     Effort: Pulmonary effort is normal. No respiratory distress.  Abdominal:     Palpations: Abdomen is soft.     Tenderness: There is no guarding or rebound.     Comments: Moderate epigastric and ruq tenderness  Musculoskeletal:        General: No swelling or tenderness.  Skin:    General: Skin is warm and dry.  Neurological:     Mental Status: She is alert and oriented to person, place, and time.  Psychiatric:        Behavior: Behavior normal.      ED Treatments / Results  Labs (all labs ordered are listed, but only abnormal results are displayed) Labs Reviewed  COMPREHENSIVE METABOLIC PANEL - Abnormal; Notable for the following components:      Result Value   Glucose, Bld 117 (*)    BUN 5 (*)    All other components within normal limits  URINALYSIS, ROUTINE W REFLEX MICROSCOPIC - Abnormal; Notable for the following components:   Color, Urine STRAW (*)    Specific Gravity, Urine 1.004 (*)    All other components within normal limits  CBC WITH DIFFERENTIAL/PLATELET - Abnormal; Notable for the following components:   WBC 14.7 (*)    RBC 5.15 (*)    Hemoglobin 15.1 (*)    HCT 46.6 (*)    Neutro Abs 10.5 (*)    All other components within normal limits  SURGICAL PCR SCREEN  LIPASE, BLOOD  HIV ANTIBODY (ROUTINE TESTING W REFLEX)  COMPREHENSIVE METABOLIC PANEL  CBC  I-STAT BETA HCG BLOOD, ED ([facility], [facility], [facility] ONLY)    EKG None  Radiology US Abdomen Limited Ruq  Result Date: 07/31/2018 CLINICAL DATA:  Right upper quadrant abdominal pain for 1 day. History of hypertension. EXAM: ULTRASOUND ABDOMEN LIMITED RIGHT UPPER QUADRANT COMPARISON:  None. FINDINGS: Gallbladder: Several gallstones are noted. There is borderline gallbladder wall thickening (3.5 mm) and no pericholecystic fluid. The patient is reported to have a positive sonographic Murphy sign according to the sonographer. Common bile duct: Diameter: 2 mm Liver: Borderline increased hepatic  echogenicity without focal abnormality. Portal vein is patent on color Doppler imaging with normal direction of blood flow towards the liver. IMPRESSION: 1. Cholelithiasis with borderline gallbladder wall thickening and reported positive sonographic Murphy sign. Cannot exclude early cholecystitis. 2. No biliary dilatation. Electronically Signed   By: Carey Bullocks M.D.   On: 07/31/2018 14:57  Procedures Procedures (including critical care time)  Medications Ordered in ED Medications  enoxaparin (LOVENOX) injection 40 mg (40 mg Subcutaneous Given 07/31/18 1659)  0.9 %  sodium chloride infusion (has no administration in time range)  cefTRIAXone (ROCEPHIN) 2 g in sodium chloride 0.9 % 100 mL IVPB (has no administration in time range)  acetaminophen (TYLENOL) tablet 1,000 mg (1,000 mg Oral Given 07/31/18 1716)  ketorolac (TORADOL) 15 MG/ML injection 15 mg (has no administration in time range)  traMADol (ULTRAM) tablet 50 mg (has no administration in time range)  oxyCODONE (Oxy IR/ROXICODONE) immediate release tablet 5-10 mg (has no administration in time range)  HYDROmorphone (DILAUDID) injection 0.5 mg (has no administration in time range)  methocarbamol (ROBAXIN) tablet 500 mg (has no administration in time range)  diphenhydrAMINE (BENADRYL) capsule 25 mg (has no administration in time range)    Or  diphenhydrAMINE (BENADRYL) injection 25 mg (has no administration in time range)  docusate sodium (COLACE) capsule 100 mg (100 mg Oral Given 07/31/18 1716)  bisacodyl (DULCOLAX) suppository 10 mg (has no administration in time range)  ondansetron (ZOFRAN-ODT) disintegrating tablet 4 mg (has no administration in time range)    Or  ondansetron (ZOFRAN) injection 4 mg (has no administration in time range)  pantoprazole (PROTONIX) injection 40 mg (has no administration in time range)  metoprolol tartrate (LOPRESSOR) injection 5 mg (has no administration in time range)  hydrALAZINE (APRESOLINE)  injection 10 mg (has no administration in time range)  gabapentin (NEURONTIN) capsule 300 mg (300 mg Oral Given 07/31/18 1659)  sodium chloride 0.9 % bolus 500 mL (0 mLs Intravenous Stopped 07/31/18 1618)  ondansetron (ZOFRAN) injection 4 mg (4 mg Intravenous Given 07/31/18 1438)  morphine 4 MG/ML injection 4 mg (4 mg Intravenous Given 07/31/18 1440)     Initial Impression / Assessment and Plan / ED Course  I have reviewed the triage vital signs and the nursing notes.  Pertinent labs & imaging results that were available during my care of the patient were reviewed by me and considered in my medical decision making (see chart for details).        Patient here for evaluation of abdominal pain that started last night. She does have epigastric and right upper quadrant tenderness on examination without peritoneal findings. Labs are significant for leukocytosis, similar when compared to priors. RUQ ultrasound is consistent with cholecystitis with gallbladder wall thickening as well as positive Murphy's sign. On repeat assessment after pain medications she does have persistent pain. Discussed with patient findings of studies recommendation for admission for further management and she is in agreement with treatment plan. Dr. Fredricka Bonine with general surgery consulted for admission.  Final Clinical Impressions(s) / ED Diagnoses   Final diagnoses:  RUQ abdominal pain  Cholecystitis    ED Discharge Orders    None       Tilden Fossa, MD 07/31/18 1742

## 2018-07-31 NOTE — ED Triage Notes (Signed)
PT seen at [facility] this Am for same problem . Pt was sent home and instructed  To come to Ed for Sx's that did not improve.

## 2018-08-01 ENCOUNTER — Observation Stay (HOSPITAL_COMMUNITY): Payer: 59 | Admitting: Certified Registered Nurse Anesthetist

## 2018-08-01 ENCOUNTER — Encounter (HOSPITAL_COMMUNITY)
Admission: EM | Disposition: A | Discharge status: Home / Self Care | Payer: Self-pay | Source: Home / Self Care | Attending: Emergency Medicine

## 2018-08-01 HISTORY — PX: CHOLECYSTECTOMY: SHX55

## 2018-08-01 LAB — CBC
HCT: 38.4 % (ref 36.0–46.0)
Hemoglobin: 12.2 g/dL (ref 12.0–15.0)
MCH: 28.9 pg (ref 26.0–34.0)
MCHC: 31.8 g/dL (ref 30.0–36.0)
MCV: 91 fL (ref 80.0–100.0)
Platelets: 232 10*3/uL (ref 150–400)
RBC: 4.22 MIL/uL (ref 3.87–5.11)
RDW: 13.3 % (ref 11.5–15.5)
WBC: 8.9 10*3/uL (ref 4.0–10.5)
nRBC: 0 % (ref 0.0–0.2)

## 2018-08-01 LAB — COMPREHENSIVE METABOLIC PANEL
ALT: 16 U/L (ref 0–44)
AST: 15 U/L (ref 15–41)
Albumin: 2.7 g/dL — ABNORMAL LOW (ref 3.5–5.0)
Alkaline Phosphatase: 90 U/L (ref 38–126)
Anion gap: 8 (ref 5–15)
BUN: <5 mg/dL — ABNORMAL LOW (ref 6–20)
CO2: 25 mmol/L (ref 22–32)
Calcium: 8.2 mg/dL — ABNORMAL LOW (ref 8.9–10.3)
Chloride: 107 mmol/L (ref 98–111)
Creatinine, Ser: 0.89 mg/dL (ref 0.44–1.00)
GFR calc Af Amer: >60 mL/min (ref >60)
GFR calc non Af Amer: >60 mL/min (ref >60)
Glucose, Bld: 98 mg/dL (ref 70–99)
Potassium: 3.8 mmol/L (ref 3.5–5.1)
Sodium: 140 mmol/L (ref 135–145)
Total Bilirubin: 0.5 mg/dL (ref 0.3–1.2)
Total Protein: 5.5 g/dL — ABNORMAL LOW (ref 6.5–8.1)

## 2018-08-01 LAB — HIV ANTIBODY (ROUTINE TESTING W REFLEX): HIV Screen 4th Generation wRfx: NONREACTIVE

## 2018-08-01 SURGERY — LAPAROSCOPIC CHOLECYSTECTOMY
Anesthesia: General | Site: Abdomen

## 2018-08-01 MED ORDER — MIDAZOLAM HCL 2 MG/2ML IJ SOLN
INTRAMUSCULAR | Status: AC
  Filled 2018-08-01: qty 2

## 2018-08-01 MED ORDER — LIDOCAINE 2% (20 MG/ML) 5 ML SYRINGE
INTRAMUSCULAR | Status: AC
  Filled 2018-08-01: qty 5

## 2018-08-01 MED ORDER — PROMETHAZINE HCL 25 MG/ML IJ SOLN
6.2500 mg | Freq: Once | INTRAMUSCULAR | Status: AC
  Administered 2018-08-01: 11:00:00 6.25 mg via INTRAVENOUS

## 2018-08-01 MED ORDER — ACETAMINOPHEN 500 MG PO TABS: 1000.0000 mg | ORAL_TABLET | Freq: Once | ORAL | Status: DC | PRN

## 2018-08-01 MED ORDER — DEXAMETHASONE SODIUM PHOSPHATE 10 MG/ML IJ SOLN
INTRAMUSCULAR | Status: AC
  Filled 2018-08-01: qty 1

## 2018-08-01 MED ORDER — OXYCODONE HCL 5 MG/5ML PO SOLN: 5.0000 mg | Freq: Once | ORAL | Status: DC | PRN

## 2018-08-01 MED ORDER — PROPOFOL 10 MG/ML IV BOLUS
INTRAVENOUS | Status: DC | PRN
  Administered 2018-08-01: 160 mg via INTRAVENOUS

## 2018-08-01 MED ORDER — ROCURONIUM BROMIDE 100 MG/10ML IV SOLN
INTRAVENOUS | Status: DC | PRN
  Administered 2018-08-01: 10 mg via INTRAVENOUS
  Administered 2018-08-01: 40 mg via INTRAVENOUS

## 2018-08-01 MED ORDER — SCOPOLAMINE 1 MG/3DAYS TD PT72
MEDICATED_PATCH | TRANSDERMAL | Status: AC
  Filled 2018-08-01: qty 1

## 2018-08-01 MED ORDER — SUCCINYLCHOLINE CHLORIDE 200 MG/10ML IV SOSY
PREFILLED_SYRINGE | INTRAVENOUS | Status: DC | PRN
  Administered 2018-08-01: 110 mg via INTRAVENOUS

## 2018-08-01 MED ORDER — FENTANYL CITRATE (PF) 250 MCG/5ML IJ SOLN
INTRAMUSCULAR | Status: AC
  Filled 2018-08-01: qty 5

## 2018-08-01 MED ORDER — ACETAMINOPHEN 160 MG/5ML PO SOLN: 1000.0000 mg | Freq: Once | ORAL | Status: DC | PRN

## 2018-08-01 MED ORDER — KETOROLAC TROMETHAMINE 30 MG/ML IJ SOLN
INTRAMUSCULAR | Status: DC | PRN
  Administered 2018-08-01: 30 mg via INTRAVENOUS

## 2018-08-01 MED ORDER — SUGAMMADEX SODIUM 200 MG/2ML IV SOLN
INTRAVENOUS | Status: DC | PRN
  Administered 2018-08-01: 225 mg via INTRAVENOUS

## 2018-08-01 MED ORDER — SUCCINYLCHOLINE CHLORIDE 20 MG/ML IJ SOLN
INTRAMUSCULAR | Status: DC | PRN
  Administered 2018-08-01: 140 mg via INTRAVENOUS

## 2018-08-01 MED ORDER — IBUPROFEN 600 MG PO TABS: 600.0000 mg | ORAL_TABLET | Freq: Four times a day (QID) | ORAL | Status: DC | PRN

## 2018-08-01 MED ORDER — PROMETHAZINE HCL 25 MG/ML IJ SOLN
INTRAMUSCULAR | Status: AC
  Administered 2018-08-01: 11:00:00 6.25 mg via INTRAVENOUS
  Filled 2018-08-01: qty 1

## 2018-08-01 MED ORDER — 0.9 % SODIUM CHLORIDE (POUR BTL) OPTIME
TOPICAL | Status: DC | PRN
  Administered 2018-08-01: 1000 mL

## 2018-08-01 MED ORDER — SUCCINYLCHOLINE CHLORIDE 200 MG/10ML IV SOSY
PREFILLED_SYRINGE | INTRAVENOUS | Status: AC
  Filled 2018-08-01: qty 10

## 2018-08-01 MED ORDER — MIDAZOLAM HCL 5 MG/5ML IJ SOLN
INTRAMUSCULAR | Status: DC | PRN
  Administered 2018-08-01: 2 mg via INTRAVENOUS

## 2018-08-01 MED ORDER — DEXAMETHASONE SODIUM PHOSPHATE 4 MG/ML IJ SOLN
INTRAMUSCULAR | Status: DC | PRN
  Administered 2018-08-01: 5 mg via INTRAVENOUS

## 2018-08-01 MED ORDER — OXYCODONE HCL 5 MG PO TABS: 5.0000 mg | ORAL_TABLET | Freq: Once | ORAL | Status: DC | PRN

## 2018-08-01 MED ORDER — ROCURONIUM BROMIDE 50 MG/5ML IV SOSY
PREFILLED_SYRINGE | INTRAVENOUS | Status: AC
  Filled 2018-08-01: qty 5

## 2018-08-01 MED ORDER — SODIUM CHLORIDE 0.9 % IR SOLN
Status: DC | PRN
  Administered 2018-08-01: 1000 mL

## 2018-08-01 MED ORDER — FENTANYL CITRATE (PF) 100 MCG/2ML IJ SOLN: 25.0000 ug | INTRAMUSCULAR | Status: DC | PRN

## 2018-08-01 MED ORDER — ACETAMINOPHEN 10 MG/ML IV SOLN: 1000.0000 mg | Freq: Once | INTRAVENOUS | Status: DC | PRN

## 2018-08-01 MED ORDER — ONDANSETRON HCL 4 MG/2ML IJ SOLN
INTRAMUSCULAR | Status: AC
  Filled 2018-08-01: qty 2

## 2018-08-01 MED ORDER — LIDOCAINE HCL (CARDIAC) PF 100 MG/5ML IV SOSY
PREFILLED_SYRINGE | INTRAVENOUS | Status: DC | PRN
  Administered 2018-08-01: 60 mg via INTRAVENOUS

## 2018-08-01 MED ORDER — PROPOFOL 10 MG/ML IV BOLUS
INTRAVENOUS | Status: AC
  Filled 2018-08-01: qty 20

## 2018-08-01 MED ORDER — BUPIVACAINE-EPINEPHRINE 0.25% -1:200000 IJ SOLN
INTRAMUSCULAR | Status: DC | PRN
  Administered 2018-08-01: 18 mL

## 2018-08-01 MED ORDER — BUPIVACAINE-EPINEPHRINE (PF) 0.25% -1:200000 IJ SOLN
INTRAMUSCULAR | Status: AC
  Filled 2018-08-01: qty 30

## 2018-08-01 MED ORDER — FENTANYL CITRATE (PF) 100 MCG/2ML IJ SOLN
INTRAMUSCULAR | Status: DC | PRN
  Administered 2018-08-01 (×2): 50 ug via INTRAVENOUS
  Administered 2018-08-01: 100 ug via INTRAVENOUS

## 2018-08-01 MED ORDER — LACTATED RINGERS IV SOLN
INTRAVENOUS | Status: DC | PRN
  Administered 2018-08-01: 09:00:00 via INTRAVENOUS

## 2018-08-01 SURGICAL SUPPLY — 39 items
ADH SKN CLS APL DERMABOND .7 (GAUZE/BANDAGES/DRESSINGS) ×1
APPLIER CLIP 5 13 M/L LIGAMAX5 (MISCELLANEOUS) ×2
APR CLP MED LRG 5 ANG JAW (MISCELLANEOUS) ×1
BAG SPEC RTRVL LRG 6X4 10 (ENDOMECHANICALS) ×1
CANISTER SUCT 3000ML PPV (MISCELLANEOUS) ×2 IMPLANT
CHLORAPREP W/TINT 26ML (MISCELLANEOUS) ×2 IMPLANT
CLIP APPLIE 5 13 M/L LIGAMAX5 (MISCELLANEOUS) ×1 IMPLANT
COVER SURGICAL LIGHT HANDLE (MISCELLANEOUS) ×2 IMPLANT
DERMABOND ADVANCED (GAUZE/BANDAGES/DRESSINGS) ×1
DERMABOND ADVANCED .7 DNX12 (GAUZE/BANDAGES/DRESSINGS) ×1 IMPLANT
ELECT CAUTERY BLADE 6.4 (BLADE) ×1 IMPLANT
ELECT REM PT RETURN 9FT ADLT (ELECTROSURGICAL) ×2
ELECTRODE REM PT RTRN 9FT ADLT (ELECTROSURGICAL) ×1 IMPLANT
GLOVE BIOGEL M STRL SZ7.5 (GLOVE) ×2 IMPLANT
GLOVE BIOGEL PI IND STRL 7.0 (GLOVE) IMPLANT
GLOVE BIOGEL PI INDICATOR 7.0 (GLOVE) ×1
GLOVE INDICATOR 8.0 STRL GRN (GLOVE) ×2 IMPLANT
GOWN STRL REUS W/ TWL LRG LVL3 (GOWN DISPOSABLE) ×2 IMPLANT
GOWN STRL REUS W/ TWL XL LVL3 (GOWN DISPOSABLE) ×1 IMPLANT
GOWN STRL REUS W/TWL LRG LVL3 (GOWN DISPOSABLE) ×4
GOWN STRL REUS W/TWL XL LVL3 (GOWN DISPOSABLE) ×2
KIT BASIN OR (CUSTOM PROCEDURE TRAY) ×2 IMPLANT
KIT TURNOVER KIT B (KITS) ×2 IMPLANT
NS IRRIG 1000ML POUR BTL (IV SOLUTION) ×2 IMPLANT
PAD ARMBOARD 7.5X6 YLW CONV (MISCELLANEOUS) ×2 IMPLANT
PENCIL SMOKE EVACUATOR (MISCELLANEOUS) ×2 IMPLANT
POUCH SPECIMEN RETRIEVAL 10MM (ENDOMECHANICALS) ×1 IMPLANT
SCISSORS LAP 5X35 DISP (ENDOMECHANICALS) ×2 IMPLANT
SET IRRIG TUBING LAPAROSCOPIC (IRRIGATION / IRRIGATOR) ×2 IMPLANT
SET TUBE SMOKE EVAC HIGH FLOW (TUBING) ×2 IMPLANT
SLEEVE ENDOPATH XCEL 5M (ENDOMECHANICALS) ×4 IMPLANT
SPECIMEN JAR SMALL (MISCELLANEOUS) ×2 IMPLANT
SUT MNCRL AB 4-0 PS2 18 (SUTURE) ×2 IMPLANT
TOWEL OR 17X24 6PK STRL BLUE (TOWEL DISPOSABLE) ×2 IMPLANT
TOWEL OR 17X26 10 PK STRL BLUE (TOWEL DISPOSABLE) ×2 IMPLANT
TRAY LAPAROSCOPIC MC (CUSTOM PROCEDURE TRAY) ×2 IMPLANT
TROCAR XCEL BLUNT TIP 100MML (ENDOMECHANICALS) ×2 IMPLANT
TROCAR XCEL NON-BLD 5MMX100MML (ENDOMECHANICALS) ×2 IMPLANT
WATER STERILE IRR 1000ML POUR (IV SOLUTION) ×2 IMPLANT

## 2018-08-01 NOTE — Transfer of Care (Signed)
Immediate Anesthesia Transfer of Care Note  Patient: Brandi [name]  Procedure(s) Performed: LAPAROSCOPIC CHOLECYSTECTOMY (N/A Abdomen)  Patient Location: PACU  Anesthesia Type:General  Level of Consciousness: awake, alert , oriented and patient cooperative  Airway & Oxygen Therapy: Patient Spontanous Breathing  Post-op Assessment: Report given to RN and Post -op Vital signs reviewed and stable  Post vital signs: Reviewed and stable  Last Vitals:  Vitals Value Taken Time  BP    Temp    Pulse    Resp    SpO2      Last Pain:  Vitals:   08/01/18 0730  TempSrc:   PainSc: 0-No pain      Patients Stated Pain Goal: 0 (07/31/18 1705)  Complications: No apparent anesthesia complications

## 2018-08-01 NOTE — Anesthesia Procedure Notes (Signed)
Procedure Name: Intubation Date/Time: 08/01/2018 9:33 AM Performed by: Tillman Abide, CRNA Pre-anesthesia Checklist: Patient identified, Emergency Drugs available, Suction available and Patient being monitored Patient Re-evaluated:Patient Re-evaluated prior to induction Oxygen Delivery Method: Circle System Utilized Preoxygenation: Pre-oxygenation with 100% oxygen Induction Type: IV induction and Cricoid Pressure applied Laryngoscope Size: Miller and 2 Grade View: Grade I Tube type: Oral Number of attempts: 1 Airway Equipment and Method: Stylet Placement Confirmation: ETT inserted through vocal cords under direct vision,  positive ETCO2 and breath sounds checked- equal and bilateral Secured at: 22 cm Tube secured with: Tape Dental Injury: Teeth and Oropharynx as per pre-operative assessment

## 2018-08-01 NOTE — Op Note (Signed)
08/01/2018 10:41 AM  PATIENT: Brandi [name]  38 y.o. female  Patient Care Team: Richmond Campbell., PA-C as PCP - General (Family Medicine)  PRE-OPERATIVE DIAGNOSIS: Acute cholecystitis  POST-OPERATIVE DIAGNOSIS: Same  PROCEDURE: Laparoscopic Cholecystectomy  SURGEON: Stephanie Coup. Jaser Fullen, MD  ASSISTANT: OR staff  ANESTHESIA: General endotracheal  EBL: No intake/output data recorded.  DRAINS: None  SPECIMEN: Gallbladder  COUNTS: Sponge, needle and instrument counts were reported correct x2 at the conclusion of the operation  DISPOSITION: PACU in satisfactory condition  COMPLICATIONS: None  FINDINGS: Acute inflamed appearing gallbladder. Critical view of safety was obtained prior to clipping or dividing any structures. Normal cystic anatomy identified; no posterior cystic artery encountered.  INDICATION:  Ms. Schoenbauer is a very pleasant 37yoF with hx of HTN and hypothyroidism whom presented to the ED yesterday with epigastric abdominal and RUQ pain of acute onset. She was seen and evaluated - noted to exhibit tenderness in epigastrium and RUQ. Leukocytosis of 14.7. LFTs and lipase were normal. RUQ US demonstrated gallstones with borderline gallbladder wall thickening (3.5 mm) and postive sonographic Murphy's. CBD measured 2 mm.  No pericholecystic fluid was identified.  Clinically, she was assessed as having acute cholecystitis.  She was admitted to the hospital and put on antibiotics.  Her pain began to improve on antibiotics.  Options have been discussed with her moving forward by Dr. Doylene Canard, Dr. Derrell Lolling and myself.  She opted undergo surgical intervention.  The anatomy and physiology of the hepatobiliary system was discussed at length with the patient. The pathophysiology of gallbladder disease was discussed at length as well. The options for treatment were discussed including ongoing observation which may result in subsequent/worsening and recurrent gallbladder complications  (infection, pancreatitis, choledocholithiasis, etc). We discussed laparoscopic cholecystectomy. The procedure, material risks (including, but not limited to, pain, bleeding, infection, scarring, need for blood transfusion, damage to surrounding structures- blood vessels/nerves/viscus/organs, damage to bile duct, bile leak, need for additional procedures, hernia, DVT/PE, worsening of pre-existing medical conditions, pancreatitis, pneumonia, heart attack, stroke, death) benefits and alternatives to surgery were discussed at length. I noted a good probability that the procedure would help improve her symptoms (50-90%). The patient's questions were answered to her satisfaction, she voiced understanding and they elected to proceed with surgery. Additionally, we discussed typical postoperative expectations and the recovery process.  DESCRIPTION:   The patient was identified & brought into the operating room. She was then positioned supine on the OR table. SCDs were in place and active during the entire case. She then underwent general endotracheal anesthesia. Pressure points were padded. The abdomen was prepped and draped in the standard sterile fashion. Antibiotics were administered. A surgical timeout was performed and confirmed our plan.   A periumbilical incision was made. The umbilical stalk was grasped and retracted outwardly. The infraumbilical fascia was identified and incised. The peritoneal cavity was gently entered bluntly. A purse-string 0 Vicryl suture was placed. The Hasson cannula was inserted into the peritoneal cavity and insufflation with CO2 commenced to . A laparoscope was inserted into the peritoneal cavity and inspection confirmed no evidence of trocar site complications. The patient was then positioned in reverse Trendelenburg with slight left side down. 3 additional 46mm trocars were placed along the right subcostal line - one 67mm port in mid subcostal region, another 59mm port in the  right flank near the anterior axillary line, and a third 40mm port in the left subxiphoid region obliquely near the falciform ligament.  The liver and gallbladder were inspected. The  gallbladder did appear acutely inflamed consistent with acute cholecystitis.  There were omental adhesions to the gallbladder which were gently taken down sharply. The gallbladder fundus was grasped and elevated cephalad. An additional grasper was then placed on the infundibulum of the gallbladder and the infundibulum was retracted laterally. Staying high on the gallbladder, the peritoneum on both sides of the gallbladder was opened with hook cautery. Gentle blunt dissection was then employed with a [place] gallery [place] dissector working down into ComcastCalot's triangle. The cystic duct was identified and carefully circumferentially dissected. The cystic artery was also identified and carefully circumferentially dissected. The space between the cystic artery and hepatocystic plate was developed such that a good view of the liver could be seen through a window medial to the cystic artery. The triangle of Calot had been cleared of all fibrofatty tissue. At this point, a critical view of safety was achieved and the only structures visualized was the skeletonized cystic duct laterally, the skeletonized cystic artery and the liver through the window medial to the artery. No posterior cystic artery was noted.  Her cystic duct was somewhat foreshortened and given her normal LFTs and lipase, the decision was made not to proceed with a cholangiogram given the potential increased risk for backwall injury to the common bile duct in this setting.  The cystic duct and artery were clipped with 2 clips on the patient side and 1 clip on the specimen side. The cystic duct and artery were then divided with EndoShears. The gallbladder was then freed from its remaining attachments to the liver using electrocautery and placed into an endocatch bag. The RUQ was gently  irrigated with sterile saline. Hemostasis was then verified. The clips were in good position; the gallbladder fossa was dry.  The endocatch bag containing the gallbladder was then removed from the umbilical port site and passed off as specimen. The RUQ ports were removed under direct visualization and noted to be hemostatic. The umbilical fascia was then closed using the 0 Vicryl purse-string suture. The fascia was palpated and noted to be completely closed. The skin of all incision sites was approximated with 4-0 monocryl subcuticular suture and dermabond applied. She was then awakened from anesthesia, extubated, and transferred to a stretcher for transport to PACU in satisfactory condition.

## 2018-08-01 NOTE — Anesthesia Preprocedure Evaluation (Addendum)
Anesthesia Evaluation  Patient identified by MRN, date of birth, ID band Patient awake    Reviewed: Allergy & Precautions, NPO status , Patient's Chart, lab work & pertinent test results  History of Anesthesia Complications (+) PONV and history of anesthetic complications  Airway Mallampati: II  TM Distance: >3 FB Neck ROM: Full    Dental  (+) Teeth Intact   Pulmonary neg shortness of breath, neg sleep apnea, neg COPD, neg recent URI, former smoker,    breath sounds clear to auscultation       Cardiovascular hypertension,  Rhythm:Regular     Neuro/Psych negative neurological ROS  negative psych ROS   GI/Hepatic negative GI ROS, Neg liver ROS,   Endo/Other  Hypothyroidism Morbid obesity  Renal/GU      Musculoskeletal   Abdominal   Peds  Hematology negative hematology ROS (+)   Anesthesia Other Findings   Reproductive/Obstetrics                            Anesthesia Physical Anesthesia Plan  ASA: III  Anesthesia Plan: General   Post-op Pain Management:    Induction: Intravenous and Rapid sequence  PONV Risk Score and Plan: 3 and Ondansetron, Dexamethasone and Scopolamine patch - Pre-op  Airway Management Planned: Oral ETT  Additional Equipment: None  Intra-op Plan:   Post-operative Plan: Extubation in OR  Informed Consent: I have reviewed the patients History and Physical, chart, labs and discussed the procedure including the risks, benefits and alternatives for the proposed anesthesia with the patient or authorized representative who has indicated his/her understanding and acceptance.     Dental advisory given  Plan Discussed with: CRNA and Surgeon  Anesthesia Plan Comments:        Anesthesia Quick Evaluation

## 2018-08-01 NOTE — Progress Notes (Signed)
Day of Surgery  Subjective: Occasional twinges of RUQ pain but basically feeling much better.  No nausea or vomiting. Stable and alert Labs this morning showed normal LFTs.  WBC down to 8900.  Hemoglobin 12.2 She is ready to go ahead with laparoscopic cholecystectomy this morning  Objective: Vital signs in last 24 hours: Temp:  [98 F (36.7 C)-98.7 F (37.1 C)] 98.2 F (36.8 C) (04/12 0442) Pulse Rate:  [58-83] 59 (04/12 0442) Resp:  [15-16] 16 (04/12 0442) BP: (113-143)/(72-93) 116/80 (04/12 0442) SpO2:  [95 %-100 %] 97 % (04/12 0442) Weight:  [108.4 kg] 108.4 kg (04/11 1325) Last BM Date: 07/31/18  Intake/Output from previous day: 04/11 0701 - 04/12 0700 In: 614 [P.O.:114; IV Piggyback:500] Out: -  Intake/Output this shift: No intake/output data recorded.  General appearance: Alert.  Cooperative.  No distress.  Good insight.  Mental status normal.  Elevated BMI Resp: clear to auscultation bilaterally GI: soft, non-tender; bowel sounds normal; no masses,  no organomegaly Extremities: extremities normal, atraumatic, no cyanosis or edema  Lab Results:  Recent Labs    07/31/18 1430 08/01/18 0244  WBC 14.7* 8.9  HGB 15.1* 12.2  HCT 46.6* 38.4  PLT 313 232   BMET Recent Labs    07/31/18 1430 08/01/18 0244  NA 138 140  K 3.8 3.8  CL 100 107  CO2 26 25  GLUCOSE 117* 98  BUN 5* <5*  CREATININE 0.89 0.89  CALCIUM 9.5 8.2*   PT/INR No results for input(s): LABPROT, INR in the last 72 hours. ABG No results for input(s): PHART, HCO3 in the last 72 hours.  Invalid input(s): PCO2, PO2  Studies/Results: Koreas Abdomen Limited Ruq  Result Date: 07/31/2018 CLINICAL DATA:  Right upper quadrant abdominal pain for 1 day. History of hypertension. EXAM: ULTRASOUND ABDOMEN LIMITED RIGHT UPPER QUADRANT COMPARISON:  None. FINDINGS: Gallbladder: Several gallstones are noted. There is borderline gallbladder wall thickening (3.5 mm) and no pericholecystic fluid. The patient is  reported to have a positive sonographic Murphy sign according to the sonographer. Common bile duct: Diameter: 2 mm Liver: Borderline increased hepatic echogenicity without focal abnormality. Portal vein is patent on color Doppler imaging with normal direction of blood flow towards the liver. IMPRESSION: 1. Cholelithiasis with borderline gallbladder wall thickening and reported positive sonographic Murphy sign. Cannot exclude early cholecystitis. 2. No biliary dilatation. Electronically Signed   By: Carey BullocksWilliam  Veazey M.D.   On: 07/31/2018 14:57    Anti-infectives: Anti-infectives (From admission, onward)   Start     Dose/Rate Route Frequency Ordered Stop   07/31/18 1600  cefTRIAXone (ROCEPHIN) 2 g in sodium chloride 0.9 % 100 mL IVPB     2 g 200 mL/hr over 30 Minutes Intravenous Every 24 hours 07/31/18 1537          Jama Krichbaum Grayce SessionsM Rhea Kaelin 08/01/2018 Assessment/Plan: s/p Procedure(s): LAPAROSCOPIC CHOLECYSTECTOMY  Acute and chronic cholecystitis. Proceed with laparoscopic cholecystectomy, possible cholangiogram today. She is aware that Dr. Cliffton AstersWhite will be the surgeon and is comfortable with that I discussed the indications, details, techniques, and numerous risk of the surgery with her.  She is aware of the risk of bleeding, infection, conversion open laparotomy, bile leak, pancreatitis, injury to adjacent organs, and other unforeseen problems.  She understands these issues.  All her questions are answered.  She agrees with this plan.  Discussed with the operating room.  Surgery this morning.  Patient advised this may be as early as 830.   LOS: 0 days    [facility]  Grayce Sessions 08/01/2018

## 2018-08-01 NOTE — Anesthesia Postprocedure Evaluation (Signed)
Anesthesia Post Note  Patient: Miara Ahlquist  Procedure(s) Performed: LAPAROSCOPIC CHOLECYSTECTOMY (N/A Abdomen)     Patient location during evaluation: PACU Anesthesia Type: General Level of consciousness: awake and alert Pain management: pain level controlled Vital Signs Assessment: post-procedure vital signs reviewed and stable Respiratory status: spontaneous breathing, nonlabored ventilation, respiratory function stable and patient connected to nasal cannula oxygen Cardiovascular status: blood pressure returned to baseline and stable Postop Assessment: no apparent nausea or vomiting Anesthetic complications: no    Last Vitals:  Vitals:   08/01/18 1153 08/01/18 1421  BP: 117/84 131/90  Pulse: (!) 57 (!) 49  Resp:    Temp: 37.1 C 36.9 C  SpO2: 99% 95%    Last Pain:  Vitals:   08/01/18 1421  TempSrc: Oral  PainSc:                  Sherril Heyward

## 2018-08-02 ENCOUNTER — Encounter (HOSPITAL_COMMUNITY): Payer: Self-pay | Admitting: Surgery

## 2018-08-02 MED ORDER — OXYCODONE HCL 5 MG PO TABS: 5.0000 mg | ORAL_TABLET | Freq: Four times a day (QID) | ORAL | 0 refills | Status: DC | PRN

## 2018-08-02 NOTE — Plan of Care (Signed)

## 2018-08-02 NOTE — Discharge Instructions (Signed)

## 2018-08-02 NOTE — Progress Notes (Signed)
PIV removed. No respiratory distress. AVS instructions given and education provided. No further questions. D/C via wheelchair.

## 2018-08-02 NOTE — Discharge Summary (Signed)
[facility] Discharge Summary   Patient ID: Brandi [name] MRN: [id number] DOB/AGE: 39/29/1982 39 y.o.  Admit date: 07/31/2018 Discharge date: 08/02/2018  Admitting Diagnosis: Acute cholecystitis  Discharge Diagnosis Patient Active Problem List   Diagnosis Date Noted  . Acute cholecystitis 07/31/2018  . Preeclampsia 05/24/2013    Consultants none  Imaging: Koreas Abdomen Limited Ruq  Result Date: 07/31/2018 CLINICAL DATA:  Right upper quadrant abdominal pain for 1 day. History of hypertension. EXAM: ULTRASOUND ABDOMEN LIMITED RIGHT UPPER QUADRANT COMPARISON:  None. FINDINGS: Gallbladder: Several gallstones are noted. There is borderline gallbladder wall thickening (3.5 mm) and no pericholecystic fluid. The patient is reported to have a positive sonographic Murphy sign according to the sonographer. Common bile duct: Diameter: 2 mm Liver: Borderline increased hepatic echogenicity without focal abnormality. Portal vein is patent on color Doppler imaging with normal direction of blood flow towards the liver. IMPRESSION: 1. Cholelithiasis with borderline gallbladder wall thickening and reported positive sonographic Murphy sign. Cannot exclude early cholecystitis. 2. No biliary dilatation. Electronically Signed   By: Carey BullocksWilliam  Veazey M.D.   On: 07/31/2018 14:57    Procedures Dr. Cliffton AstersWhite (08/01/18) - Laparoscopic Cholecystectomy  HPI: This is a very pleasant and relatively healthy 38 year old woman who came to the emergency department with abdominal pain.  This began yesterday evening after dinner (she had fish and potatoes) and is a severe burning pain which is predominant over the epigastrium and subxiphoid location as well as a severe pain in the right subcostal margin.  She did have some associated vomiting this morning and diarrhea.  Pain is constant.  Aggravated by movement.  Denies any fevers, melena, hematochezia, or hematemesis.  No known sick contacts, and no one else  became ill after eating the same food.  She went to urgent care this morning and was treated with Pepcid but her symptoms continued and worsened and thus she presents to the ER. She has not had any prior abdominal surgery.  Hospital Course:  Workup showed acute cholecystitis.  Patient was admitted and underwent procedure listed above.  Tolerated procedure well and was transferred to the floor.  Diet was advanced as tolerated.  On POD#1, the patient was voiding well, tolerating diet, ambulating well, pain well controlled, vital signs stable, incisions c/d/i and felt stable for discharge home.  Patient will follow up as outlined below and knows to call with questions or concerns.     Patient was discharged in good condition.  The [place] Substance controlled database was reviewed prior to prescribing narcotic pain medication to this patient.  Physical Exam: General:  Alert, NAD, pleasant, cooperative Cardio: RRR, S1 & S2 normal, no murmur, rubs, gallops Resp: Effort normal, lungs CTA bilaterally, no wheezes, rales, rhonchi Abd:  Soft, ND, normal bowel sounds, very mild TTP in RUQ without guarding, no peritonitis, incisions with glue intact look good without bleeding or drainage  Skin: warm and dry, no rashes noted  Allergies as of 08/02/2018      Reactions   Augmentin [amoxicillin-pot Clavulanate] Nausea And Vomiting   Sulfa Antibiotics Hives      Medication List    STOP taking these medications   ferrous sulfate 325 (65 FE) MG tablet   ibuprofen 600 MG tablet Commonly known as:  ADVIL,MOTRIN   oxyCODONE-acetaminophen 5-325 MG tablet Commonly known as:  PERCOCET/ROXICET     TAKE these medications   fluticasone 50 MCG/ACT nasal spray Commonly known as:  FLONASE Place 1-2 sprays into both nostrils daily as needed for  allergies.   levothyroxine 88 MCG tablet Commonly known as:  SYNTHROID, LEVOTHROID Take 88 mcg by mouth daily before breakfast.   oxyCODONE 5 MG immediate  release tablet Commonly known as:  Oxy IR/ROXICODONE Take 1 tablet (5 mg total) by mouth every 6 (six) hours as needed for moderate pain.        Follow-up Information    [facility] Follow up on 08/17/2018.   Specialty:  General Surgery Why:  a provider will call you at your scheduled appointment time for a televisit. Please take a photo of your incisions and send them to [email or web address] with your name and date of birth  Contact information: [address] [place] [address] [phone]          Signed: Joyce [facility] 08/02/2018, 7:43 AM Pager: [phone] Consults: [phone] Mon-Fri 7:00 am-4:30 pm Sat-Sun 7:00 am-11:30 am

## 2018-12-16 ENCOUNTER — Other Ambulatory Visit: Payer: Self-pay | Admitting: Podiatry

## 2018-12-16 ENCOUNTER — Ambulatory Visit: Payer: 59

## 2018-12-16 ENCOUNTER — Ambulatory Visit (INDEPENDENT_AMBULATORY_CARE_PROVIDER_SITE_OTHER): Payer: 59

## 2018-12-16 ENCOUNTER — Encounter: Payer: Self-pay | Admitting: Podiatry

## 2018-12-16 ENCOUNTER — Other Ambulatory Visit: Payer: Self-pay

## 2018-12-16 ENCOUNTER — Ambulatory Visit: Payer: 59 | Admitting: Podiatry

## 2018-12-16 DIAGNOSIS — M779 Enthesopathy, unspecified: Secondary | ICD-10-CM

## 2018-12-16 DIAGNOSIS — M79672 Pain in left foot: Secondary | ICD-10-CM

## 2018-12-16 DIAGNOSIS — M722 Plantar fascial fibromatosis: Secondary | ICD-10-CM

## 2018-12-16 MED ORDER — DICLOFENAC SODIUM 75 MG PO TBEC: 75.0000 mg | DELAYED_RELEASE_TABLET | Freq: Two times a day (BID) | ORAL | 2 refills | Status: DC

## 2018-12-16 NOTE — Patient Instructions (Signed)

## 2018-12-16 NOTE — Progress Notes (Signed)
   Subjective:    Patient ID: Brandi [name], female    DOB: [birth year], 38 y.o.   MRN: [id number]  HPI    Review of Systems  All other systems reviewed and are negative.      Objective:   Physical Exam        Assessment & Plan:

## 2018-12-17 NOTE — Progress Notes (Signed)
Subjective:   Patient ID: Brandi [name], female   DOB: 38 y.o.   MRN: [id number]   HPI Patient presents stating she has had problems with the mid arch left and has had trouble with the heel and at times the outside of the foot.  States it hurts all the time and is worsened over the past 8 months and there is been some discomfort for several year..  Patient does not smoke likes to be active   Review of Systems  All other systems reviewed and are negative.       Objective:  Physical Exam Vitals signs and nursing note reviewed.  Constitutional:      Appearance: She is well-developed.  Pulmonary:     Effort: Pulmonary effort is normal.  Musculoskeletal: Normal range of motion.  Skin:    General: Skin is warm.  Neurological:     Mental Status: She is alert.     Neurovascular status intact muscle strength was found to be adequate range of motion within normal limits with mid arch inflammation left that is painful when pressed with mild discomfort in the heel forefoot but at this time symptoms I was not able to elicit and they generally occur when she is more active.  Patient has good digital perfusion well oriented x3     Assessment:  What appears to be mid arch fasciitis left which may be compensatory in nature secondary to other problems that the patient has experienced difficulty being able to separate these different issues     Plan:  H&P x-rays reviewed and today I did do a mid arch injection 3 mg Kenalog 5 mg Xylocaine applied air fracture walker in order to try to reduce all the weightbearing stresses on the foot.  Patient may require other treatments possible MRI or physical therapy depending on response  X-ray was negative for indications that there appears to be any kind of bony pathology spur or other pathology and I did go ahead and also placed on diclofenac 75 mg twice daily today along with immobilization signed visit

## 2019-01-20 ENCOUNTER — Ambulatory Visit: Payer: 59 | Admitting: Podiatry

## 2019-09-19 IMAGING — US ULTRASOUND ABDOMEN LIMITED
1 series · 14 of 25 positions shown · non-contrast
Comparison: None.

CLINICAL DATA: Right upper quadrant abdominal pain for 1 day.
History of hypertension.

EXAM:
ULTRASOUND ABDOMEN LIMITED RIGHT UPPER QUADRANT

[Series 1: ultrasound abdomen limited · 14 of 55 slices shown]
[im 1/55]
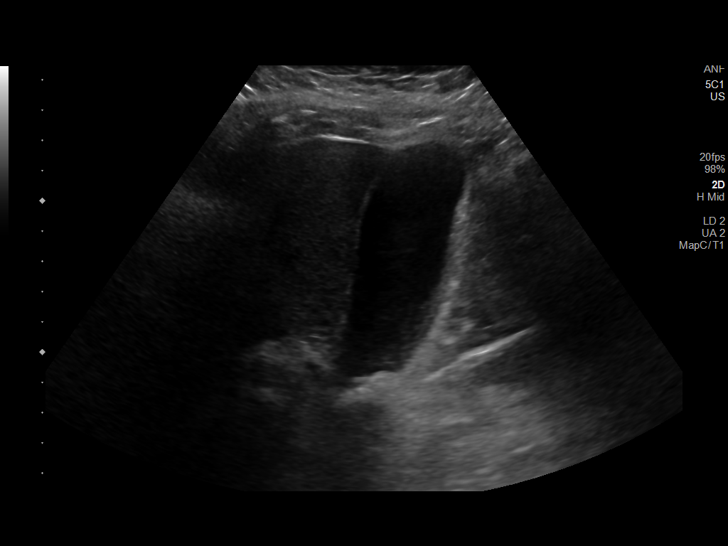
[im 5/55]
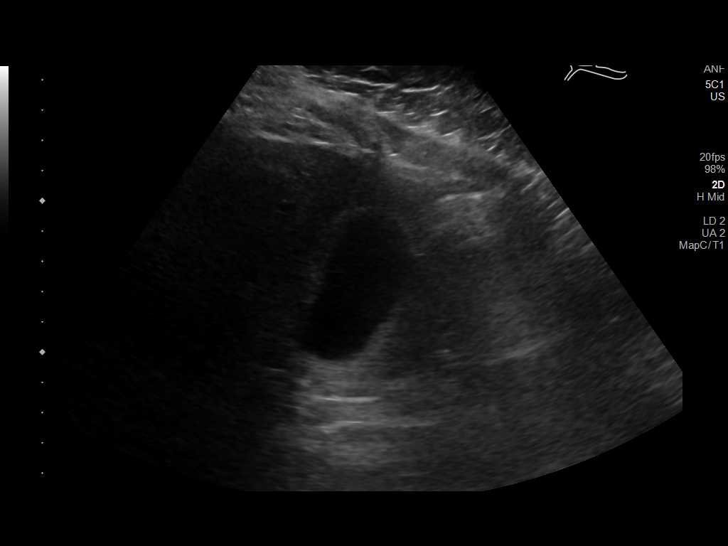
[im 10/55]
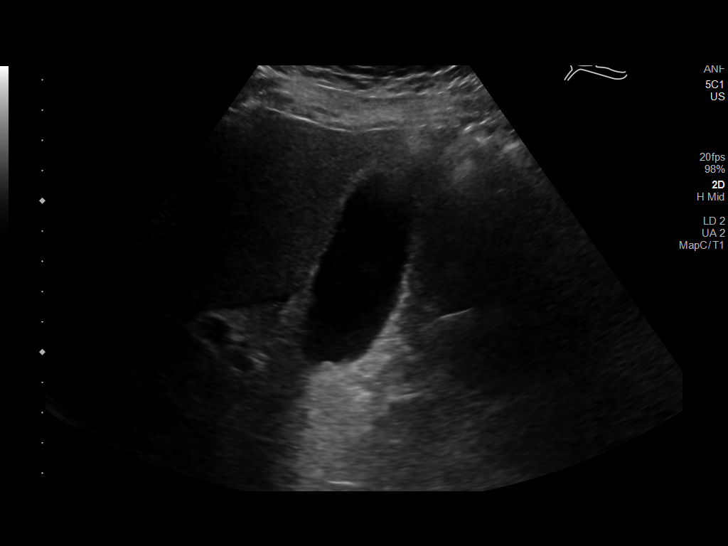
[im 14/55]
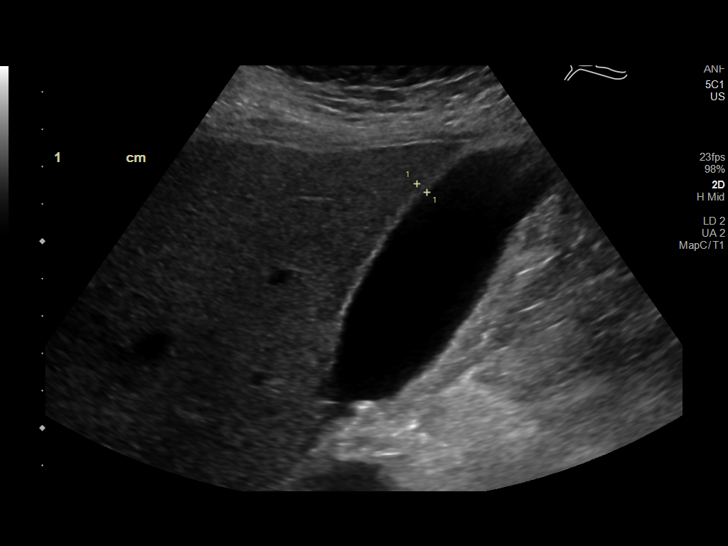
[im 19/55]
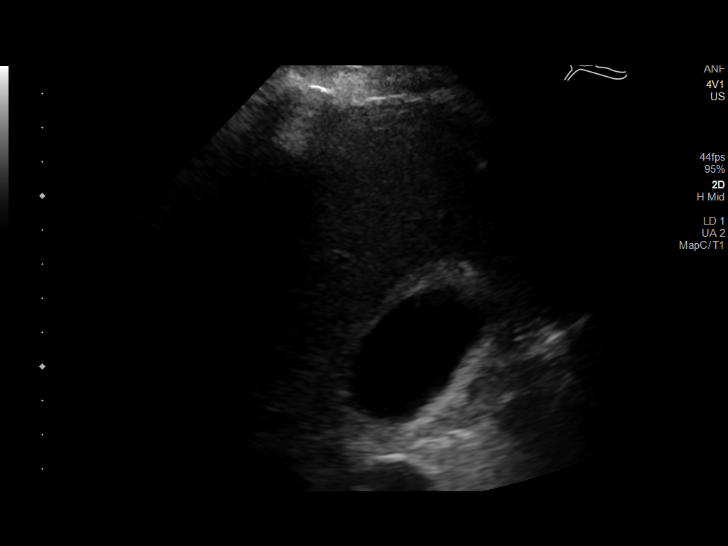
[im 21/55]
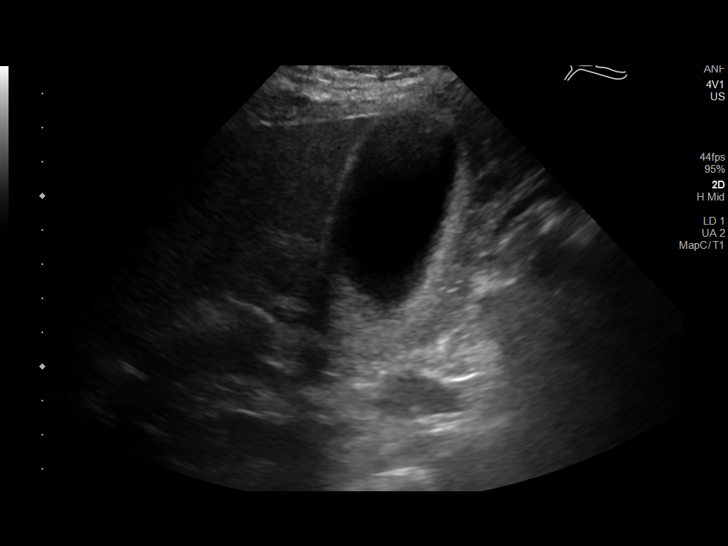
[im 25/55]
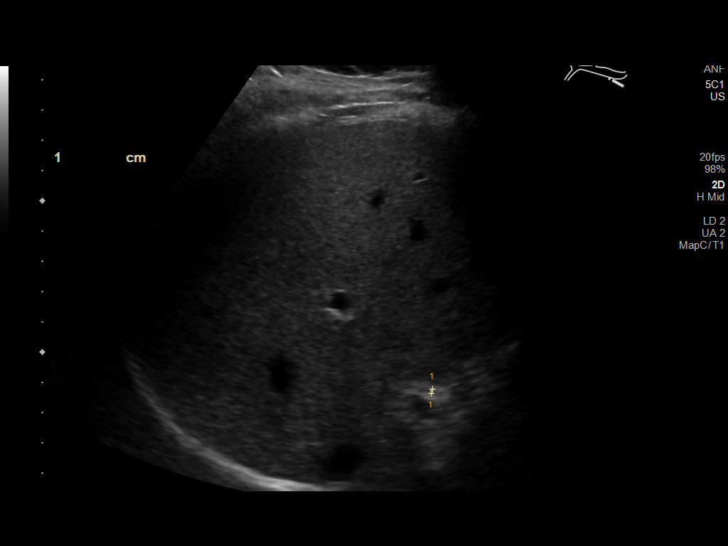
[im 30/55]
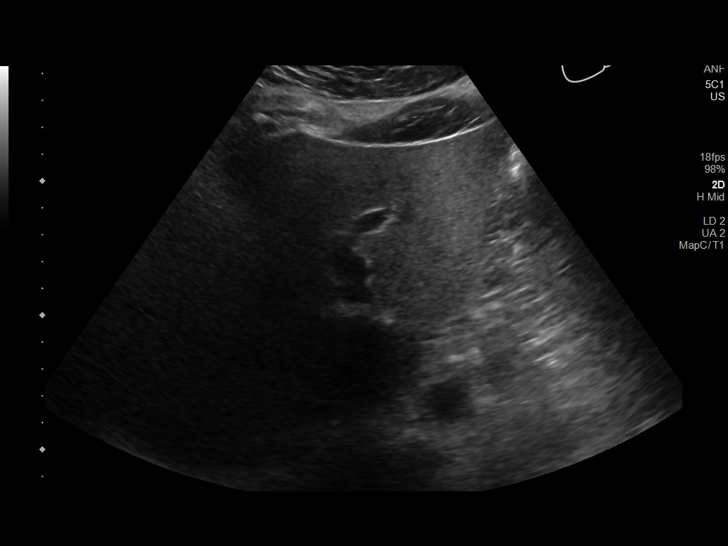
[im 34/55]
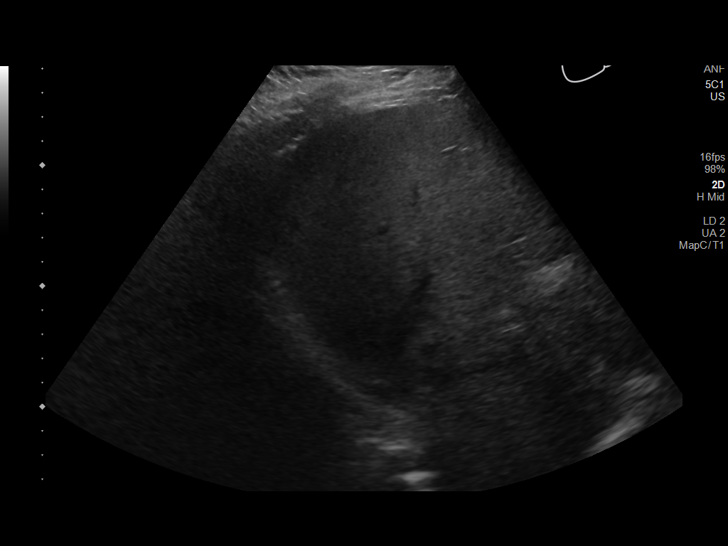
[im 37/55]
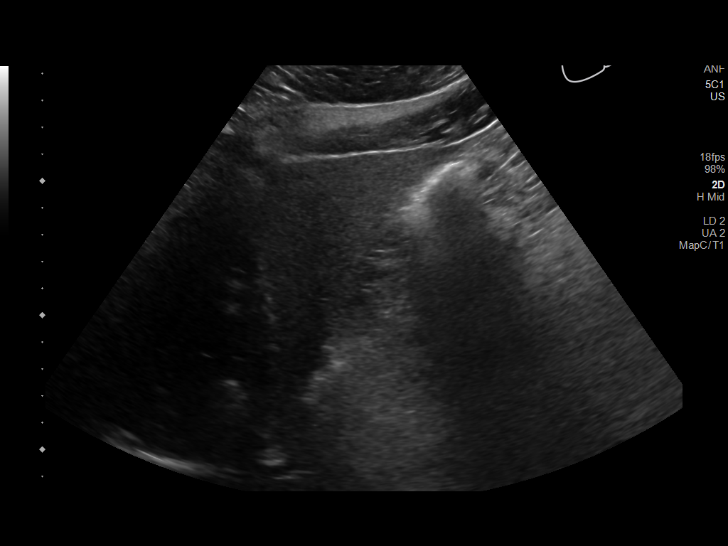
[im 41/55]
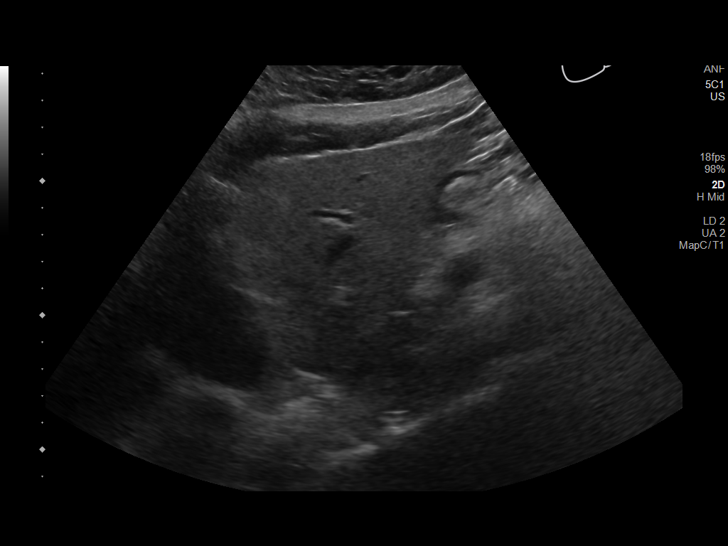
[im 46/55]
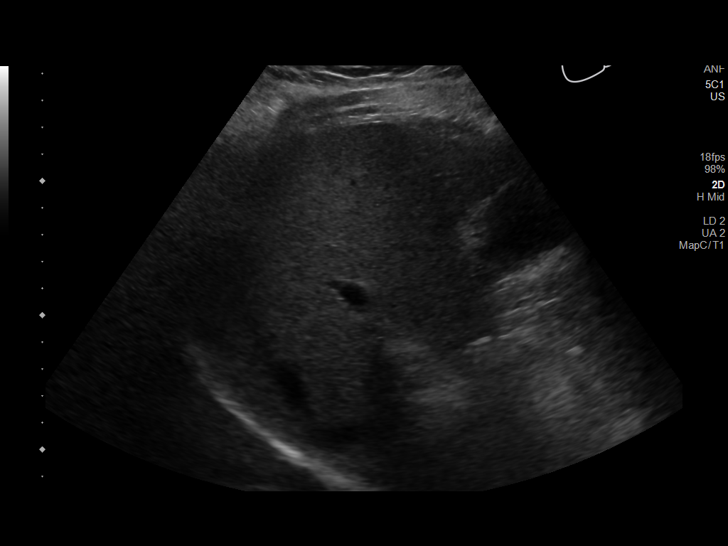
[im 50/55]
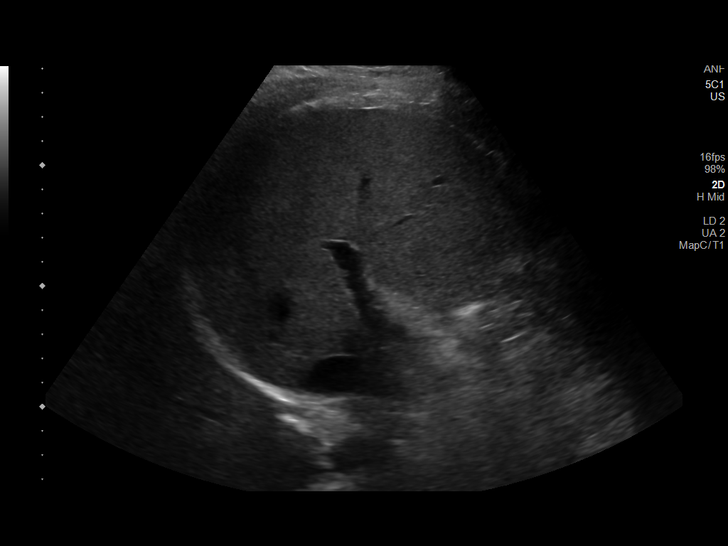
[im 55/55]
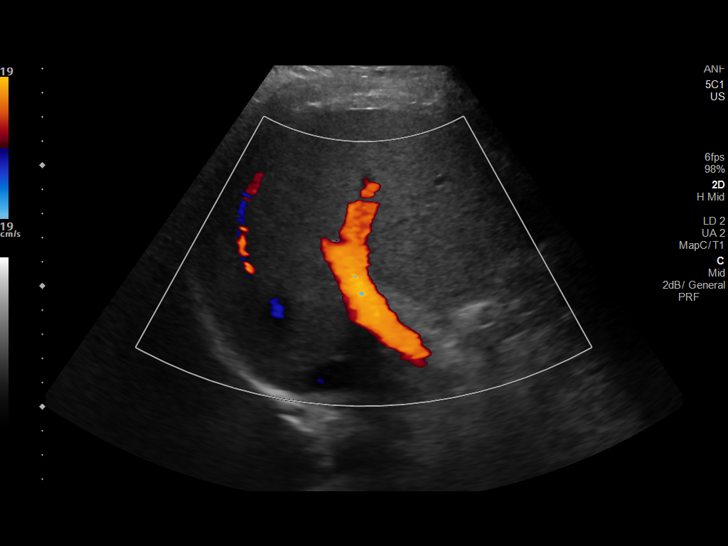

[14 of 25 positions shown; findings below may reference images not displayed]

FINDINGS: Gallbladder:

Several gallstones are noted. There is borderline gallbladder wall
thickening (3.5 mm) and no pericholecystic fluid. The patient is
reported to have a positive sonographic Murphy sign according to the
sonographer.

Common bile duct:

Diameter: 2 mm

Liver:

Borderline increased hepatic echogenicity without focal abnormality.
Portal vein is patent on color Doppler imaging with normal direction
of blood flow towards the liver.
IMPRESSION: 1. Cholelithiasis with borderline gallbladder wall thickening and
reported positive sonographic Murphy sign. Cannot exclude early
cholecystitis.
2. No biliary dilatation.

## 2022-01-27 ENCOUNTER — Ambulatory Visit (INDEPENDENT_AMBULATORY_CARE_PROVIDER_SITE_OTHER): Payer: 59

## 2022-01-27 ENCOUNTER — Encounter: Payer: Self-pay | Admitting: Podiatry

## 2022-01-27 ENCOUNTER — Ambulatory Visit: Payer: 59 | Admitting: Podiatry

## 2022-01-27 DIAGNOSIS — M205X1 Other deformities of toe(s) (acquired), right foot: Secondary | ICD-10-CM | POA: Diagnosis not present

## 2022-01-27 DIAGNOSIS — M722 Plantar fascial fibromatosis: Secondary | ICD-10-CM | POA: Diagnosis not present

## 2022-01-27 MED ORDER — DICLOFENAC SODIUM 75 MG PO TBEC: 75.0000 mg | DELAYED_RELEASE_TABLET | Freq: Two times a day (BID) | ORAL | 2 refills | Status: AC

## 2022-01-27 MED ORDER — TRIAMCINOLONE ACETONIDE 10 MG/ML IJ SUSP
10.0000 mg | Freq: Once | INTRAMUSCULAR | Status: AC
  Administered 2022-01-27: 10 mg

## 2022-01-27 NOTE — Progress Notes (Signed)
Subjective:   Patient ID: Brandi [name], female   DOB: 41 y.o.   MRN: [id number]   HPI Patient presents stating the left foot has been doing well but there is been quite a bit of discomfort in the right mid arch area and its been going on for about 3 to 4 weeks.  States it is very sore when she first gets up on it in the morning and is more on the outside arch.  Patient does not smoke likes to be active   Review of Systems  All other systems reviewed and are negative.       Objective:  Physical Exam Vitals and nursing note reviewed.  Constitutional:      Appearance: She is well-developed.  Pulmonary:     Effort: Pulmonary effort is normal.  Musculoskeletal:        General: Normal range of motion.  Skin:    General: Skin is warm.  Neurological:     Mental Status: She is alert.     Neurovascular status intact muscle strength found to be adequate range of motion within normal limits.  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  Today I went ahead and I did do a careful injection of the mid arch lateral side 3 mg Kenalog 5 mg Xylocaine applied fascial brace that was properly fitted to the arch to lift up the arch and take pressure off of this advised on supportive shoes and placed on diclofenac 75 mg twice daily.  Reappoint to recheck  X-rays were negative for signs of fracture did not indicate arthritis small spur no indications of pathology of the first MPJ

## 2022-02-10 ENCOUNTER — Ambulatory Visit: Payer: 59 | Admitting: Podiatry

## 2022-02-10 ENCOUNTER — Encounter: Payer: Self-pay | Admitting: Podiatry

## 2022-02-10 DIAGNOSIS — M722 Plantar fascial fibromatosis: Secondary | ICD-10-CM | POA: Diagnosis not present

## 2022-02-10 NOTE — Progress Notes (Signed)
Subjective:   Patient ID: Brandi [name], female   DOB: 41 y.o.   MRN: [id number]   HPI Patient states she is improved with diminishment of discomfort within the right arch   ROS      Objective:  Physical Exam  Neurovascular status intact mild to moderate discomfort still noted in the right mid arch area but improved     Assessment:  Improvement of fasciitis-like symptomatology right     Plan:  H&P reviewed condition recommended stretching exercises anti-inflammatories to be continued physical therapy and if symptoms persist we will have to consider immobilization.  Hopefully at this point it will continue to improve

## 2024-01-29 ENCOUNTER — Encounter (HOSPITAL_COMMUNITY): Payer: Self-pay

## 2024-01-29 ENCOUNTER — Emergency Department (HOSPITAL_COMMUNITY)
Admission: EM | Admit: 2024-01-29 | Discharge: 2024-01-30 | Discharge status: Against Medical Advice | Attending: Emergency Medicine | Admitting: Emergency Medicine

## 2024-01-29 ENCOUNTER — Other Ambulatory Visit: Payer: Self-pay

## 2024-01-29 ENCOUNTER — Emergency Department (HOSPITAL_COMMUNITY)

## 2024-01-29 DIAGNOSIS — I10 Essential (primary) hypertension: Secondary | ICD-10-CM | POA: Insufficient documentation

## 2024-01-29 DIAGNOSIS — Z5321 Procedure and treatment not carried out due to patient leaving prior to being seen by health care provider: Secondary | ICD-10-CM | POA: Diagnosis not present

## 2024-01-29 DIAGNOSIS — R0602 Shortness of breath: Secondary | ICD-10-CM | POA: Diagnosis not present

## 2024-01-29 DIAGNOSIS — R110 Nausea: Secondary | ICD-10-CM | POA: Insufficient documentation

## 2024-01-29 DIAGNOSIS — R079 Chest pain, unspecified: Secondary | ICD-10-CM | POA: Insufficient documentation

## 2024-01-29 HISTORY — DX: Type 2 diabetes mellitus without complications: E11.9

## 2024-01-29 LAB — CBC
HCT: 47.3 % — ABNORMAL HIGH (ref 36.0–46.0)
Hemoglobin: 15.5 g/dL — ABNORMAL HIGH (ref 12.0–15.0)
MCH: 29.9 pg (ref 26.0–34.0)
MCHC: 32.8 g/dL (ref 30.0–36.0)
MCV: 91.1 fL (ref 80.0–100.0)
Platelets: 292 K/uL (ref 150–400)
RBC: 5.19 MIL/uL — ABNORMAL HIGH (ref 3.87–5.11)
RDW: 13.4 % (ref 11.5–15.5)
WBC: 11 K/uL — ABNORMAL HIGH (ref 4.0–10.5)
nRBC: 0 % (ref 0.0–0.2)

## 2024-01-29 LAB — HCG, SERUM, QUALITATIVE: Preg, Serum: NEGATIVE

## 2024-01-29 LAB — BASIC METABOLIC PANEL WITH GFR
Anion gap: 10 (ref 5–15)
BUN: 7 mg/dL (ref 6–20)
CO2: 22 mmol/L (ref 22–32)
Calcium: 8.8 mg/dL — ABNORMAL LOW (ref 8.9–10.3)
Chloride: 104 mmol/L (ref 98–111)
Creatinine, Ser: 0.86 mg/dL (ref 0.44–1.00)
GFR, Estimated: >60 mL/min (ref >60)
Glucose, Bld: 107 mg/dL — ABNORMAL HIGH (ref 70–99)
Potassium: 4 mmol/L (ref 3.5–5.1)
Sodium: 136 mmol/L (ref 135–145)

## 2024-01-29 LAB — TROPONIN I (HIGH SENSITIVITY)
Troponin I (High Sensitivity): 2 ng/L (ref <18)
Troponin I (High Sensitivity): 3 ng/L (ref <18)

## 2024-01-29 NOTE — ED Triage Notes (Signed)
 Pt states she started metformin last night, newly diagnosed DM

## 2024-01-29 NOTE — ED Provider Triage Note (Signed)
 Emergency Medicine Provider Triage Evaluation Note  Brandi [name] , a 43 y.o. female  was evaluated in triage.  Pt complains of chest pain, shortness of breath.  Symptoms began yesterday about an hour and a half after she took her first dose of metformin.  Reports she has had some nausea as well without vomiting.  Denies fevers, chills, cough or congestion.  Denies history of similar symptoms previously.  No cardiac history.  No leg pain or leg swelling.  No recent travel or surgeries.  She is not on exogenous estrogen.  Also reports that her blood pressure has been elevated for the last few days since her doctor told her to start checking it at home, however prior to that she had not been checking her blood pressure at home.  Review of Systems  Positive:  Negative:   Physical Exam  BP (!) 159/101 (BP Location: Right Arm)   Pulse 78   Temp 98 F (36.7 C)   Resp 18   Ht 5' 3 (1.6 m)   Wt 112.9 kg   SpO2 100%   BMI 44.11 kg/m  Gen:   Awake, no distress   Resp:  Normal effort  MSK:   Moves extremities without difficulty  Other:    Medical Decision Making  Medically screening exam initiated at 12:59 PM.  Appropriate orders placed.  Brandi [name] was informed that the remainder of the evaluation will be completed by another provider, this initial triage assessment does not replace that evaluation, and the importance of remaining in the ED until their evaluation is complete.     Nora Lauraine LABOR, PA-C 01/29/24 1301

## 2024-01-29 NOTE — ED Notes (Signed)
 Pt left AMA

## 2024-01-29 NOTE — ED Triage Notes (Signed)
 Patient reports chest pain, high BP.  and shob since last night, got better and then worse again this morning so she came here. Patient just started metformin yesterday, also reports [facility] feels like an elephant is sitting on her chest.
